# Patient Record
Sex: Female | Born: 1970 | Race: Black or African American | Hispanic: No | Marital: Single | Smoking: Never smoker
Health system: Southern US, Community
[De-identification: ages and names within clinical notes are randomized; demographics above are authoritative.]

## PROBLEM LIST (undated history)

## (undated) ENCOUNTER — Ambulatory Visit: Admission: EM | Payer: BC Managed Care – PPO | Source: Home / Self Care

## (undated) DIAGNOSIS — G629 Polyneuropathy, unspecified: Secondary | ICD-10-CM

## (undated) DIAGNOSIS — I10 Essential (primary) hypertension: Secondary | ICD-10-CM

## (undated) HISTORY — PX: OTHER SURGICAL HISTORY: SHX169

## (undated) HISTORY — PX: ABDOMINAL HYSTERECTOMY: SHX81

## (undated) HISTORY — DX: Essential (primary) hypertension: I10

---

## 2012-12-25 ENCOUNTER — Ambulatory Visit: Payer: Self-pay | Admitting: Family Medicine

## 2012-12-25 LAB — URIC ACID: Uric Acid: 4.8 mg/dL (ref 2.6–6.0)

## 2013-10-25 ENCOUNTER — Ambulatory Visit: Payer: Self-pay

## 2013-12-04 DIAGNOSIS — G5690 Unspecified mononeuropathy of unspecified upper limb: Secondary | ICD-10-CM | POA: Insufficient documentation

## 2013-12-04 DIAGNOSIS — I10 Essential (primary) hypertension: Secondary | ICD-10-CM | POA: Insufficient documentation

## 2013-12-04 DIAGNOSIS — G5790 Unspecified mononeuropathy of unspecified lower limb: Secondary | ICD-10-CM | POA: Insufficient documentation

## 2014-05-17 IMAGING — CR DG KNEE COMPLETE 4+V*R*
1 series · 4 of 4 positions shown · non-contrast
Comparison: none

REASON FOR EXAM: R knee pain and swelling for a week
COMMENTS:

PROCEDURE:     MDR - MDR KNEE RT COMPLETE W/OBLIQUES  - December 25, 2012  [DATE]
RESULT:     Comparison:  None

[Series 1: ap · 0.17mm/px · 4 of 4 slices shown]
[im 1/4]
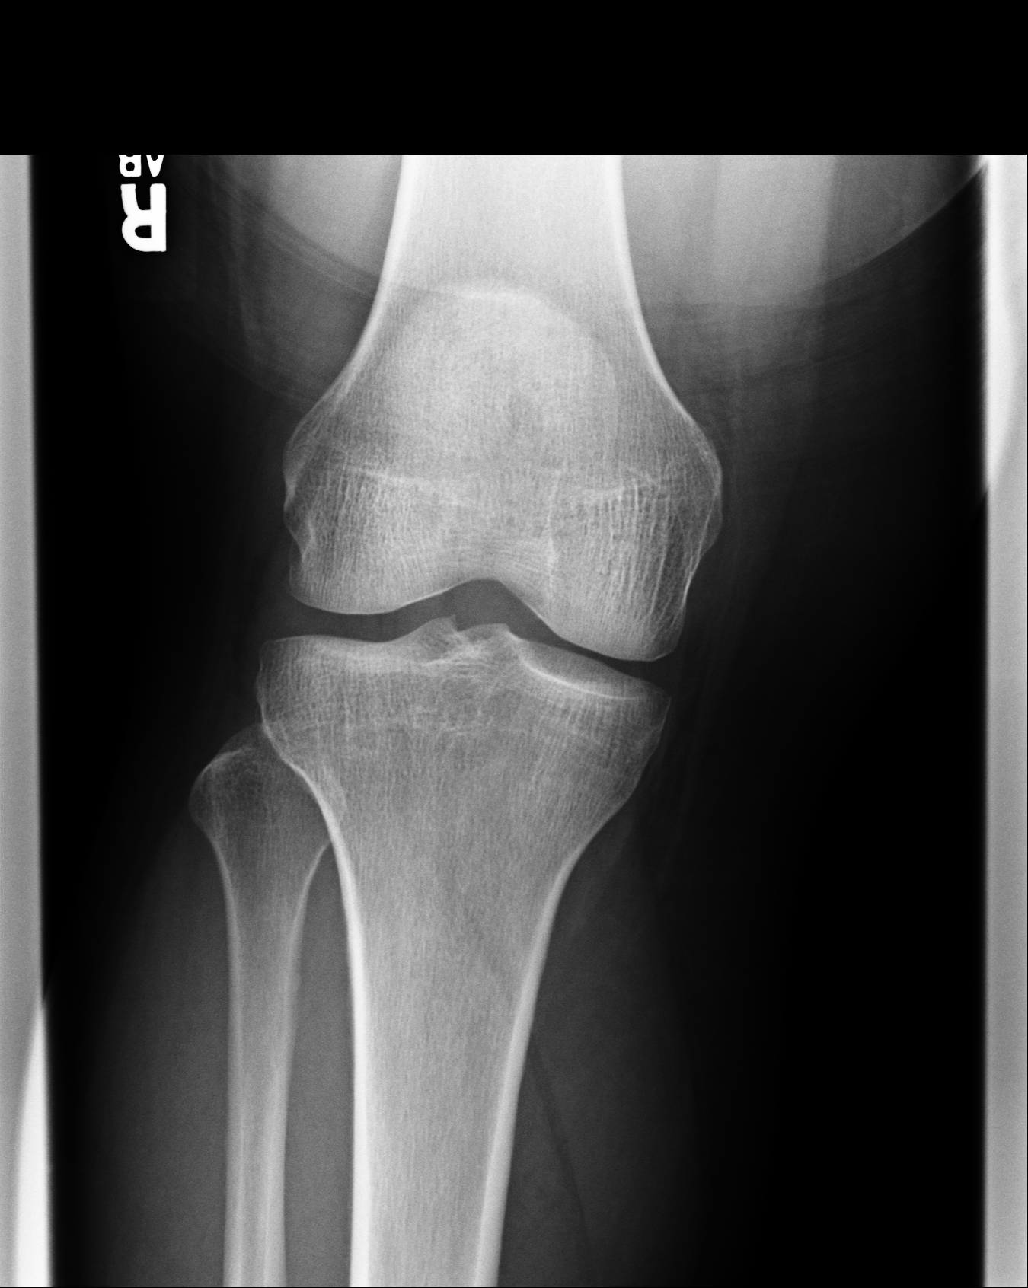
[im 2/4]
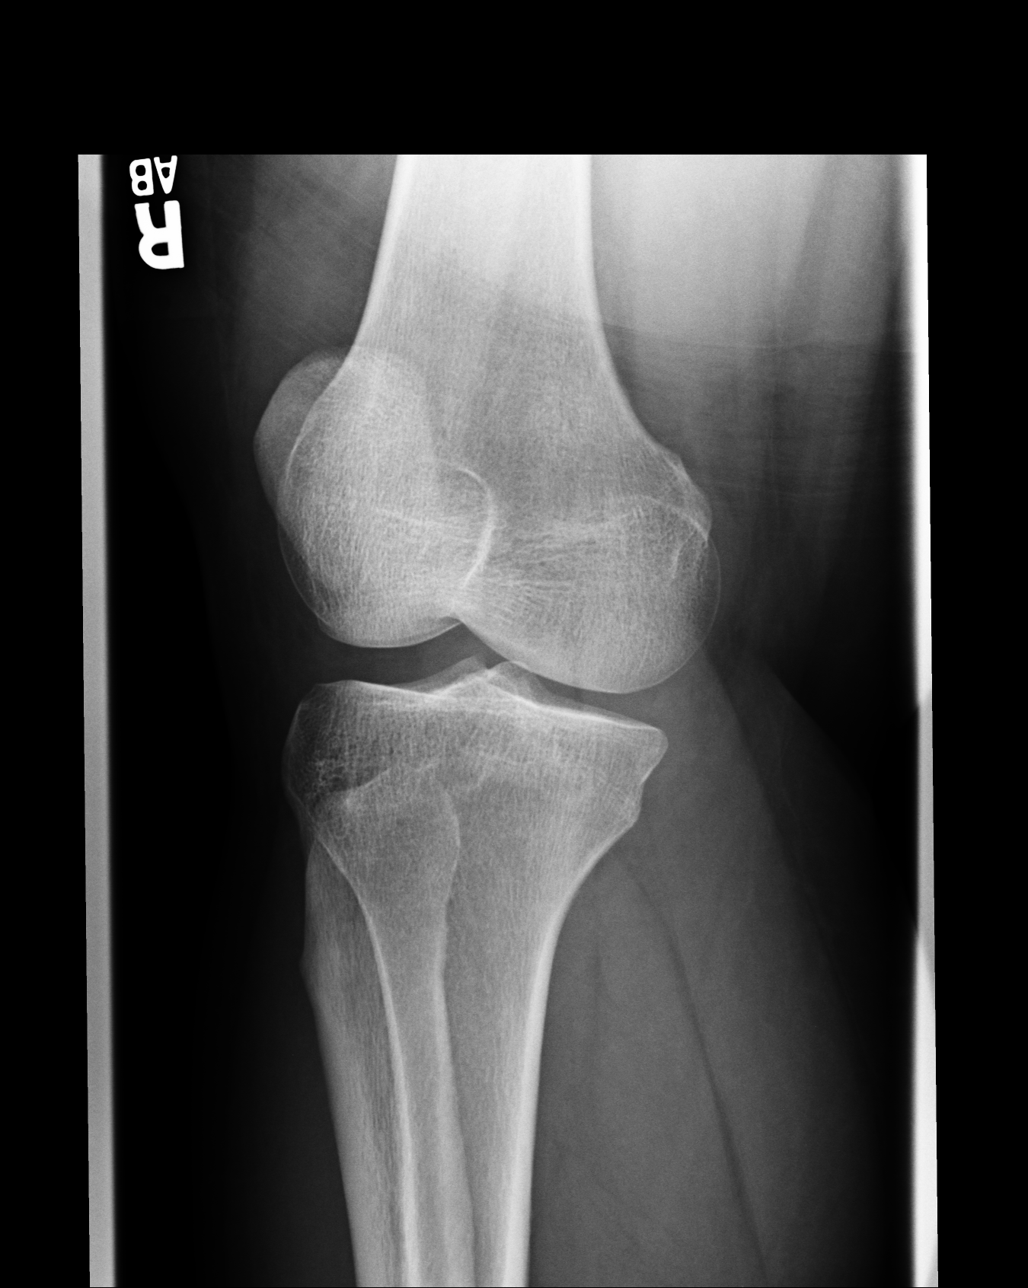
[im 3/4]
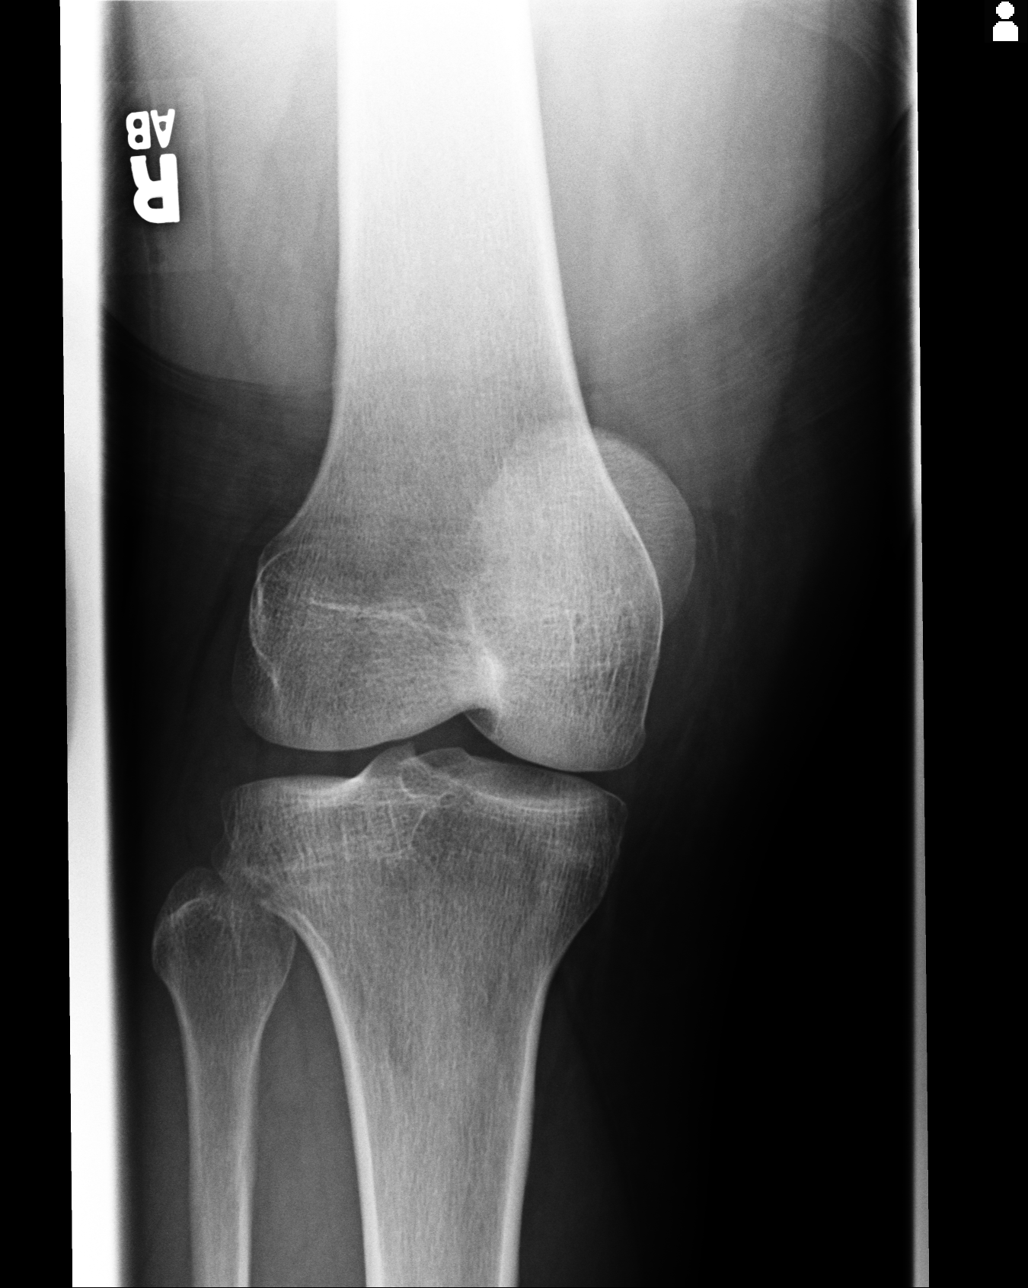
[im 4/4]
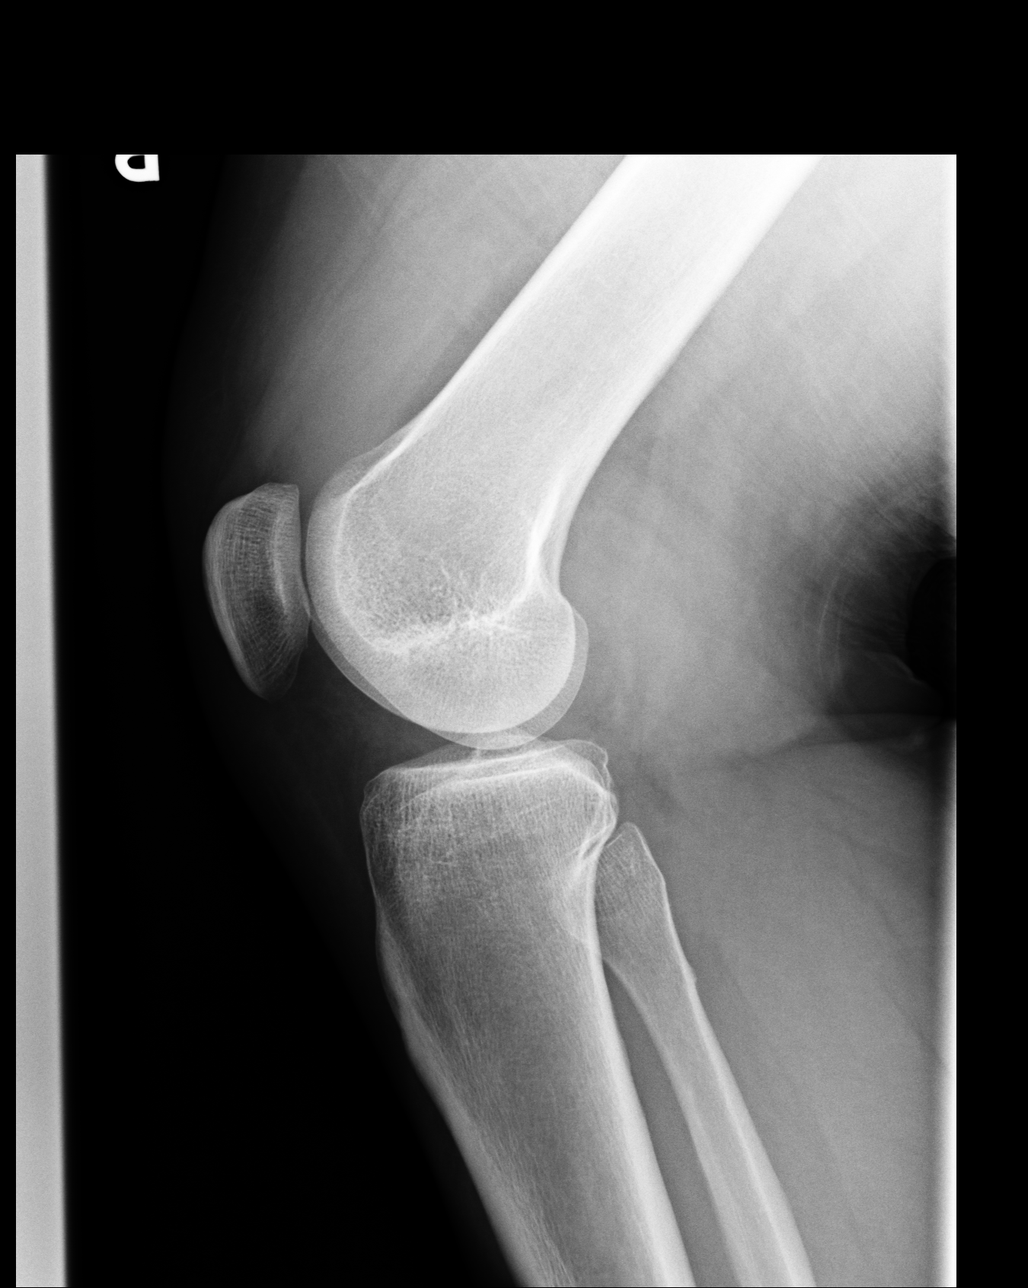

[4 of 4 positions shown; findings below may reference images not displayed]

FINDINGS: 4 views of the right knee demonstrates no acute fracture or dislocation.
There is a small joint effusion.
IMPRESSION: No acute osseous injury of the right knee.

[REDACTED]

## 2014-05-27 ENCOUNTER — Ambulatory Visit: Payer: Self-pay

## 2014-11-24 ENCOUNTER — Ambulatory Visit: Payer: Self-pay

## 2015-03-30 ENCOUNTER — Ambulatory Visit (INDEPENDENT_AMBULATORY_CARE_PROVIDER_SITE_OTHER): Payer: BLUE CROSS/BLUE SHIELD | Admitting: Podiatry

## 2015-03-30 ENCOUNTER — Ambulatory Visit (INDEPENDENT_AMBULATORY_CARE_PROVIDER_SITE_OTHER): Payer: BLUE CROSS/BLUE SHIELD

## 2015-03-30 ENCOUNTER — Encounter: Payer: Self-pay | Admitting: Podiatry

## 2015-03-30 VITALS — BP 118/86 | HR 88 | Resp 16 | Ht 64.0 in | Wt 257.0 lb

## 2015-03-30 DIAGNOSIS — M722 Plantar fascial fibromatosis: Secondary | ICD-10-CM | POA: Diagnosis not present

## 2015-03-30 DIAGNOSIS — G629 Polyneuropathy, unspecified: Secondary | ICD-10-CM

## 2015-03-30 MED ORDER — MELOXICAM 7.5 MG PO TABS: 7.5000 mg | ORAL_TABLET | Freq: Every day | ORAL | Status: DC

## 2015-03-30 MED ORDER — TRIAMCINOLONE ACETONIDE 10 MG/ML IJ SUSP
10.0000 mg | Freq: Once | INTRAMUSCULAR | Status: AC
  Administered 2015-03-30: 10 mg

## 2015-03-30 NOTE — Patient Instructions (Signed)
Plantar Fasciitis (Heel Spur Syndrome) with Rehab The plantar fascia is a fibrous, ligament-like, soft-tissue structure that spans the bottom of the foot. Plantar fasciitis is a condition that causes pain in the foot due to inflammation of the tissue. SYMPTOMS   Pain and tenderness on the underneath side of the foot.  Pain that worsens with standing or walking. CAUSES  Plantar fasciitis is caused by irritation and injury to the plantar fascia on the underneath side of the foot. Common mechanisms of injury include:  Direct trauma to bottom of the foot.  Damage to a small nerve that runs under the foot where the main fascia attaches to the heel bone.  Stress placed on the plantar fascia due to bone spurs. RISK INCREASES WITH:   Activities that place stress on the plantar fascia (running, jumping, pivoting, or cutting).  Poor strength and flexibility.  Improperly fitted shoes.  Tight calf muscles.  Flat feet.  Failure to warm-up properly before activity.  Obesity. PREVENTION  Warm up and stretch properly before activity.  Allow for adequate recovery between workouts.  Maintain physical fitness:  Strength, flexibility, and endurance.  Cardiovascular fitness.  Maintain a health body weight.  Avoid stress on the plantar fascia.  Wear properly fitted shoes, including arch supports for individuals who have flat feet. PROGNOSIS  If treated properly, then the symptoms of plantar fasciitis usually resolve without surgery. However, occasionally surgery is necessary. RELATED COMPLICATIONS   Recurrent symptoms that may result in a chronic condition.  Problems of the lower back that are caused by compensating for the injury, such as limping.  Pain or weakness of the foot during push-off following surgery.  Chronic inflammation, scarring, and partial or complete fascia tear, occurring more often from repeated injections. TREATMENT  Treatment initially involves the use of  ice and medication to help reduce pain and inflammation. The use of strengthening and stretching exercises may help reduce pain with activity, especially stretches of the Achilles tendon. These exercises may be performed at home or with a therapist. Your caregiver may recommend that you use heel cups of arch supports to help reduce stress on the plantar fascia. Occasionally, corticosteroid injections are given to reduce inflammation. If symptoms persist for greater than 6 months despite non-surgical (conservative), then surgery may be recommended.  MEDICATION   If pain medication is necessary, then nonsteroidal anti-inflammatory medications, such as aspirin and ibuprofen, or other minor pain relievers, such as acetaminophen, are often recommended.  Do not take pain medication within 7 days before surgery.  Prescription pain relievers may be given if deemed necessary by your caregiver. Use only as directed and only as much as you need.  Corticosteroid injections may be given by your caregiver. These injections should be reserved for the most serious cases, because they may only be given a certain number of times. HEAT AND COLD  Cold treatment (icing) relieves pain and reduces inflammation. Cold treatment should be applied for 10 to 15 minutes every 2 to 3 hours for inflammation and pain and immediately after any activity that aggravates your symptoms. Use ice packs or massage the area with a piece of ice (ice massage).  Heat treatment may be used prior to performing the stretching and strengthening activities prescribed by your caregiver, physical therapist, or athletic trainer. Use a heat pack or soak the injury in warm water. SEEK IMMEDIATE MEDICAL CARE IF:  Treatment seems to offer no benefit, or the condition worsens.  Any medications produce adverse side effects. EXERCISES RANGE   OF MOTION (ROM) AND STRETCHING EXERCISES - Plantar Fasciitis (Heel Spur Syndrome) These exercises may help you  when beginning to rehabilitate your injury. Your symptoms may resolve with or without further involvement from your physician, physical therapist or athletic trainer. While completing these exercises, remember:   Restoring tissue flexibility helps normal motion to return to the joints. This allows healthier, less painful movement and activity.  An effective stretch should be held for at least 30 seconds.  A stretch should never be painful. You should only feel a gentle lengthening or release in the stretched tissue. RANGE OF MOTION - Toe Extension, Flexion  Sit with your right / left leg crossed over your opposite knee.  Grasp your toes and gently pull them back toward the top of your foot. You should feel a stretch on the bottom of your toes and/or foot.  Hold this stretch for __________ seconds.  Now, gently pull your toes toward the bottom of your foot. You should feel a stretch on the top of your toes and or foot.  Hold this stretch for __________ seconds. Repeat __________ times. Complete this stretch __________ times per day.  RANGE OF MOTION - Ankle Dorsiflexion, Active Assisted  Remove shoes and sit on a chair that is preferably not on a carpeted surface.  Place right / left foot under knee. Extend your opposite leg for support.  Keeping your heel down, slide your right / left foot back toward the chair until you feel a stretch at your ankle or calf. If you do not feel a stretch, slide your bottom forward to the edge of the chair, while still keeping your heel down.  Hold this stretch for __________ seconds. Repeat __________ times. Complete this stretch __________ times per day.  STRETCH - Gastroc, Standing  Place hands on wall.  Extend right / left leg, keeping the front knee somewhat bent.  Slightly point your toes inward on your back foot.  Keeping your right / left heel on the floor and your knee straight, shift your weight toward the wall, not allowing your back to  arch.  You should feel a gentle stretch in the right / left calf. Hold this position for __________ seconds. Repeat __________ times. Complete this stretch __________ times per day. STRETCH - Soleus, Standing  Place hands on wall.  Extend right / left leg, keeping the other knee somewhat bent.  Slightly point your toes inward on your back foot.  Keep your right / left heel on the floor, bend your back knee, and slightly shift your weight over the back leg so that you feel a gentle stretch deep in your back calf.  Hold this position for __________ seconds. Repeat __________ times. Complete this stretch __________ times per day. STRETCH - Gastrocsoleus, Standing  Note: This exercise can place a lot of stress on your foot and ankle. Please complete this exercise only if specifically instructed by your caregiver.   Place the ball of your right / left foot on a step, keeping your other foot firmly on the same step.  Hold on to the wall or a rail for balance.  Slowly lift your other foot, allowing your body weight to press your heel down over the edge of the step.  You should feel a stretch in your right / left calf.  Hold this position for __________ seconds.  Repeat this exercise with a slight bend in your right / left knee. Repeat __________ times. Complete this stretch __________ times per day.    STRENGTHENING EXERCISES - Plantar Fasciitis (Heel Spur Syndrome)  These exercises may help you when beginning to rehabilitate your injury. They may resolve your symptoms with or without further involvement from your physician, physical therapist or athletic trainer. While completing these exercises, remember:   Muscles can gain both the endurance and the strength needed for everyday activities through controlled exercises.  Complete these exercises as instructed by your physician, physical therapist or athletic trainer. Progress the resistance and repetitions only as guided. STRENGTH -  Towel Curls  Sit in a chair positioned on a non-carpeted surface.  Place your foot on a towel, keeping your heel on the floor.  Pull the towel toward your heel by only curling your toes. Keep your heel on the floor.  If instructed by your physician, physical therapist or athletic trainer, add ____________________ at the end of the towel. Repeat __________ times. Complete this exercise __________ times per day. STRENGTH - Ankle Inversion  Secure one end of a rubber exercise band/tubing to a fixed object (table, pole). Loop the other end around your foot just before your toes.  Place your fists between your knees. This will focus your strengthening at your ankle.  Slowly, pull your big toe up and in, making sure the band/tubing is positioned to resist the entire motion.  Hold this position for __________ seconds.  Have your muscles resist the band/tubing as it slowly pulls your foot back to the starting position. Repeat __________ times. Complete this exercises __________ times per day.  Document Released: 10/23/2005 Document Revised: 01/15/2012 Document Reviewed: 02/04/2009 [employer] Patient Information 2015 [employer]. This information is not intended to replace advice given to you by your health care provider. Make sure you discuss any questions you have with your health care provider.  

## 2015-03-31 ENCOUNTER — Encounter: Payer: Self-pay | Admitting: Podiatry

## 2015-04-04 DIAGNOSIS — M722 Plantar fascial fibromatosis: Secondary | ICD-10-CM | POA: Insufficient documentation
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
## 2015-04-20 ENCOUNTER — Ambulatory Visit (INDEPENDENT_AMBULATORY_CARE_PROVIDER_SITE_OTHER): Payer: BLUE CROSS/BLUE SHIELD

## 2015-04-20 ENCOUNTER — Ambulatory Visit (INDEPENDENT_AMBULATORY_CARE_PROVIDER_SITE_OTHER): Payer: BLUE CROSS/BLUE SHIELD | Admitting: Podiatry

## 2015-04-20 ENCOUNTER — Encounter: Payer: Self-pay | Admitting: Podiatry

## 2015-04-20 DIAGNOSIS — M722 Plantar fascial fibromatosis: Secondary | ICD-10-CM

## 2015-04-20 MED ORDER — TRIAMCINOLONE ACETONIDE 10 MG/ML IJ SUSP
10.0000 mg | Freq: Once | INTRAMUSCULAR | Status: AC
  Administered 2015-04-20: 10 mg
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
## 2015-04-26 ENCOUNTER — Other Ambulatory Visit: Payer: Self-pay | Admitting: Podiatry

## 2015-05-18 ENCOUNTER — Encounter: Payer: Self-pay | Admitting: Podiatry

## 2015-05-18 ENCOUNTER — Ambulatory Visit (INDEPENDENT_AMBULATORY_CARE_PROVIDER_SITE_OTHER): Payer: BLUE CROSS/BLUE SHIELD | Admitting: Podiatry

## 2015-05-18 VITALS — BP 148/104 | HR 85 | Resp 18

## 2015-05-18 DIAGNOSIS — M722 Plantar fascial fibromatosis: Secondary | ICD-10-CM

## 2015-05-18 DIAGNOSIS — M629 Disorder of muscle, unspecified: Secondary | ICD-10-CM

## 2015-05-18 MED ORDER — TRIAMCINOLONE ACETONIDE 10 MG/ML IJ SUSP
10.0000 mg | Freq: Once | INTRAMUSCULAR | Status: AC
  Administered 2015-05-18: 10 mg
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
## 2015-06-04 ENCOUNTER — Telehealth: Payer: Self-pay | Admitting: *Deleted

## 2015-06-04 NOTE — Telephone Encounter (Signed)
-----   Message from Sinda Du sent at 06/04/2015 12:08 PM EDT ----- Regarding: MRI Amy from [facility] called to inform us that they have been calling Patient Heidi Kline to schedule her MRI but no answer.

## 2015-10-13 DIAGNOSIS — M722 Plantar fascial fibromatosis: Secondary | ICD-10-CM

## 2015-12-30 ENCOUNTER — Ambulatory Visit
Admission: EM | Admit: 2015-12-30 | Discharge: 2015-12-30 | Disposition: A | Discharge status: Home / Self Care | Payer: Medicaid Other | Attending: Internal Medicine | Admitting: Internal Medicine

## 2015-12-30 DIAGNOSIS — I10 Essential (primary) hypertension: Secondary | ICD-10-CM | POA: Insufficient documentation

## 2015-12-30 DIAGNOSIS — R05 Cough: Secondary | ICD-10-CM | POA: Insufficient documentation

## 2015-12-30 DIAGNOSIS — R51 Headache: Secondary | ICD-10-CM | POA: Diagnosis present

## 2015-12-30 DIAGNOSIS — J069 Acute upper respiratory infection, unspecified: Secondary | ICD-10-CM | POA: Diagnosis not present

## 2015-12-30 DIAGNOSIS — B9789 Other viral agents as the cause of diseases classified elsewhere: Secondary | ICD-10-CM

## 2015-12-30 LAB — RAPID INFLUENZA A&B ANTIGENS
Influenza A (ARMC): NOT DETECTED
Influenza B (ARMC): NOT DETECTED

## 2015-12-30 MED ORDER — HYDROCOD POLST-CPM POLST ER 10-8 MG/5ML PO SUER: 5.0000 mL | Freq: Two times a day (BID) | ORAL | Status: DC | PRN

## 2015-12-30 MED ORDER — FLUTICASONE PROPIONATE 50 MCG/ACT NA SUSP: 2.0000 | Freq: Every day | NASAL | Status: DC

## 2015-12-30 NOTE — ED Notes (Signed)
Started 2 days ago with sinus congestion and pressure. + cough, no fever and bilateral ear pain

## 2015-12-30 NOTE — Discharge Instructions (Signed)
Upper Respiratory Infection, Adult °Most upper respiratory infections (URIs) are a viral infection of the air passages leading to the lungs. A URI affects the nose, throat, and upper air passages. The most common type of URI is nasopharyngitis and is typically referred to as "the common cold." °URIs run their course and usually go away on their own. Most of the time, a URI does not require medical attention, but sometimes a bacterial infection in the upper airways can follow a viral infection. This is called a secondary infection. Sinus and middle ear infections are common types of secondary upper respiratory infections. °Bacterial pneumonia can also complicate a URI. A URI can worsen asthma and chronic obstructive pulmonary disease (COPD). Sometimes, these complications can require emergency medical care and may be life threatening.  °CAUSES °Almost all URIs are caused by viruses. A virus is a type of germ and can spread from one person to another.  °RISKS FACTORS °You may be at risk for a URI if:  °· You smoke.   °· You have chronic heart or lung disease. °· You have a weakened defense (immune) system.   °· You are very young or very old.   °· You have nasal allergies or asthma. °· You work in crowded or poorly ventilated areas. °· You work in health care facilities or schools. °SIGNS AND SYMPTOMS  °Symptoms typically develop 2-3 days after you come in contact with a cold virus. Most viral URIs last 7-10 days. However, viral URIs from the influenza virus (flu virus) can last 14-18 days and are typically more severe. Symptoms may include:  °· Runny or stuffy (congested) nose.   °· Sneezing.   °· Cough.   °· Sore throat.   °· Headache.   °· Fatigue.   °· Fever.   °· Loss of appetite.   °· Pain in your forehead, behind your eyes, and over your cheekbones (sinus pain). °· Muscle aches.   °DIAGNOSIS  °Your health care provider may diagnose a URI by: °· Physical exam. °· Tests to check that your symptoms are not due to  another condition such as: °· Strep throat. °· Sinusitis. °· Pneumonia. °· Asthma. °TREATMENT  °A URI goes away on its own with time. It cannot be cured with medicines, but medicines may be prescribed or recommended to relieve symptoms. Medicines may help: °· Reduce your fever. °· Reduce your cough. °· Relieve nasal congestion. °HOME CARE INSTRUCTIONS  °· Take medicines only as directed by your health care provider.   °· Gargle warm saltwater or take cough drops to comfort your throat as directed by your health care provider. °· Use a warm mist humidifier or inhale steam from a shower to increase air moisture. This may make it easier to breathe. °· Drink enough fluid to keep your urine clear or pale yellow.   °· Eat soups and other clear broths and maintain good nutrition.   °· Rest as needed.   °· Return to work when your temperature has returned to normal or as your health care provider advises. You may need to stay home longer to avoid infecting others. You can also use a face mask and careful hand washing to prevent spread of the virus. °· Increase the usage of your inhaler if you have asthma.   °· Do not use any tobacco products, including cigarettes, chewing tobacco, or electronic cigarettes. If you need help quitting, ask your health care provider. °PREVENTION  °The best way to protect yourself from getting a cold is to practice good hygiene.  °· Avoid oral or hand contact with people with cold   symptoms.   °· Wash your hands often if contact occurs.   °There is no clear evidence that vitamin C, vitamin E, echinacea, or exercise reduces the chance of developing a cold. However, it is always recommended to get plenty of rest, exercise, and practice good nutrition.  °SEEK MEDICAL CARE IF:  °· You are getting worse rather than better.   °· Your symptoms are not controlled by medicine.   °· You have chills. °· You have worsening shortness of breath. °· You have brown or red mucus. °· You have yellow or brown nasal  discharge. °· You have pain in your face, especially when you bend forward. °· You have a fever. °· You have swollen neck glands. °· You have pain while swallowing. °· You have white areas in the back of your throat. °SEEK IMMEDIATE MEDICAL CARE IF:  °· You have severe or persistent: °¨ Headache. °¨ Ear pain. °¨ Sinus pain. °¨ Chest pain. °· You have chronic lung disease and any of the following: °¨ Wheezing. °¨ Prolonged cough. °¨ Coughing up blood. °¨ A change in your usual mucus. °· You have a stiff neck. °· You have changes in your: °¨ Vision. °¨ Hearing. °¨ Thinking. °¨ Mood. °MAKE SURE YOU:  °· Understand these instructions. °· Will watch your condition. °· Will get help right away if you are not doing well or get worse. °  °This information is not intended to replace advice given to you by your health care provider. Make sure you discuss any questions you have with your health care provider. °  °Document Released: 04/18/2001 Document Revised: 03/09/2015 Document Reviewed: 01/28/2014 °Elsevier Interactive Patient Education ©2016 [employer] ° °Cough, Adult °A cough helps to clear your throat and lungs. A cough may last only 2-3 weeks (acute), or it may last longer than 8 weeks (chronic). Many different things can cause a cough. A cough may be a sign of an illness or another medical condition. °HOME CARE °· Pay attention to any changes in your cough. °· Take medicines only as told by your doctor. °¨ If you were prescribed an antibiotic medicine, take it as told by your doctor. Do not stop taking it even if you start to feel better. °¨ Talk with your doctor before you try using a cough medicine. °· Drink enough fluid to keep your pee (urine) clear or pale yellow. °· If the air is dry, use a cold steam vaporizer or humidifier in your home. °· Stay away from things that make you cough at work or at home. °· If your cough is worse at night, try using extra pillows to raise your head up higher while you  sleep. °· Do not smoke, and try not to be around smoke. If you need help quitting, ask your doctor. °· Do not have caffeine. °· Do not drink alcohol. °· Rest as needed. °GET HELP IF: °· You have new problems (symptoms). °· You cough up yellow fluid (pus). °· Your cough does not get better after 2-3 weeks, or your cough gets worse. °· Medicine does not help your cough and you are not sleeping well. °· You have pain that gets worse or pain that is not helped with medicine. °· You have a fever. °· You are losing weight and you do not know why. °· You have night sweats. °GET HELP RIGHT AWAY IF: °· You cough up blood. °· You have trouble breathing. °· Your heartbeat is very fast. °  °This information is not intended to replace   advice given to you by your health care provider. Make sure you discuss any questions you have with your health care provider. °  °Document Released: 07/06/2011 Document Revised: 07/14/2015 Document Reviewed: 12/30/2014 °Elsevier Interactive Patient Education ©2016 [employer]. ° °

## 2015-12-30 NOTE — ED Provider Notes (Signed)
CSN: [id number]     Arrival date & time 12/30/15  1411 History   First MD Initiated Contact with Patient 12/30/15 1553     Chief Complaint  Patient presents with  . Facial Pain   (Consider location/radiation/quality/duration/timing/severity/associated sxs/prior Treatment) HPI Comments: This is a 45 year old African American female presents to urgent care complaining of facial pain. The patient points to her ears and jaw as a source of the pain. The pain began around 2 days ago after the patient began experiencing cough and headache. She has not had any fevers but does admit to body aches. Patient also complains of sore throat and decreased appetite. 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  The history is provided by the patient.    Past Medical History  Diagnosis Date  . Hypertension    Past Surgical History  Procedure Laterality Date  . Skin grafts    . Abdominal hysterectomy     Family History  Problem Relation Age of Onset  . Diabetes Mother   . Cancer Mother   . Diabetes Father   . Cancer Father    Social History  Substance Use Topics  . Smoking status: Never Smoker   . Smokeless tobacco: None  . Alcohol Use: No   OB History    No data available     Review of Systems  Constitutional: Positive for appetite change and fatigue. Negative for fever and diaphoresis.  HENT: Negative for facial swelling.   Respiratory: Positive for cough. Negative for shortness of breath and wheezing.   Cardiovascular: Negative for chest pain.  Genitourinary: Negative for dysuria, urgency and frequency.  Neurological: Positive for headaches.  Hematological: Positive for adenopathy.    Allergies  Percocet  Home Medications   Prior to Admission medications   Medication Sig Start Date End Date Taking? Authorizing Provider  carbamazepine (TEGRETOL) 200 MG tablet  02/16/15  Yes Historical Provider, MD  gabapentin (NEURONTIN) 300 MG capsule  Take 300 mg by mouth 3 (three) times daily.   Yes Historical Provider, MD  lisinopril (PRINIVIL,ZESTRIL) 10 MG tablet Take 10 mg by mouth daily.   Yes Historical Provider, MD  meloxicam (MOBIC) 7.5 MG tablet TAKE 1 TABLET (7.5 MG TOTAL) BY MOUTH DAILY. 04/26/15  Yes Vivi Barrack, DPM  traZODone (DESYREL) 50 MG tablet  03/11/15  Yes Historical Provider, MD  chlorpheniramine-HYDROcodone (TUSSIONEX PENNKINETIC ER) 10-8 MG/5ML SUER Take 5 mLs by mouth every 12 (twelve) hours as needed for cough. 12/30/15   Arnaldo Natal, MD   Meds Ordered and Administered this Visit  Medications - No data to display  BP 146/98 mmHg  Pulse 85  Temp(Src) 98.3 F (36.8 C) (Oral)  Resp 16  Ht  (1.626 m)  Wt 243 lb (110.224 kg)  BMI 41.69 kg/m2  SpO2 100% No data found.   Physical Exam  Constitutional: She is oriented to person, place, and time. She appears well-developed and well-nourished. No distress.  HENT:  Head: Normocephalic and atraumatic.  Right Ear: A middle ear effusion is present.  Left Ear: A middle ear effusion is present.  Mouth/Throat: Oropharynx is clear and moist.  Petechiae present posterior oropharynx  Eyes: Conjunctivae and EOM are normal. Pupils are equal, round, and reactive to light. No scleral icterus.  Neck: Normal range of motion. Neck supple. No JVD present. No tracheal deviation present. No thyromegaly present.  Cardiovascular: Normal rate, regular rhythm and normal heart sounds.  Exam reveals no gallop and no friction rub.   No murmur heard. Pulmonary/Chest: Effort normal and breath sounds normal.  Abdominal: Soft. Bowel sounds are normal. She exhibits no distension. There is no tenderness.  Musculoskeletal: Normal range of motion. She exhibits no edema.  Lymphadenopathy:    She has cervical adenopathy.       Right cervical: Superficial cervical adenopathy present.  Neurological: She is alert and oriented to person, place, and time. No cranial nerve deficit.   Skin: Skin is warm and dry.  Psychiatric: She has a normal mood and affect. Her behavior is normal. Judgment and thought content normal.  Nursing note and vitals reviewed.   ED Course  Procedures (including critical care time)  Labs Review Labs Reviewed  RAPID INFLUENZA A&B ANTIGENS [facility] ONLY)    Imaging Review No results found.   Visual Acuity Review  Right Eye Distance:   Left Eye Distance:   Bilateral Distance:    Right Eye Near:   Left Eye Near:    Bilateral Near:         MDM   1. Viral URI with cough    Suspicion is low for the flu. The patient may have a flu-like virus. No indication for Tamiflu at this time. Return to clinic for inability to maintain oral intake for 36-48 hours or for worsening symptoms including fever, nausea, vomiting or diarrhea.    Arnaldo Natal, MD 12/30/15 [phone]

## 2016-01-13 ENCOUNTER — Other Ambulatory Visit: Payer: Self-pay | Admitting: Family Medicine

## 2016-01-13 ENCOUNTER — Ambulatory Visit
Admission: RE | Admit: 2016-01-13 | Discharge: 2016-01-13 | Disposition: A | Discharge status: Home / Self Care | Payer: Self-pay | Source: Ambulatory Visit | Attending: Family Medicine | Admitting: Family Medicine

## 2016-01-13 DIAGNOSIS — R7611 Nonspecific reaction to tuberculin skin test without active tuberculosis: Secondary | ICD-10-CM | POA: Insufficient documentation

## 2016-04-06 ENCOUNTER — Ambulatory Visit
Admission: EM | Admit: 2016-04-06 | Discharge: 2016-04-06 | Disposition: A | Discharge status: Home / Self Care | Payer: Medicaid Other | Attending: Family Medicine | Admitting: Family Medicine

## 2016-04-06 ENCOUNTER — Encounter: Payer: Self-pay | Admitting: Emergency Medicine

## 2016-04-06 DIAGNOSIS — R300 Dysuria: Secondary | ICD-10-CM | POA: Diagnosis present

## 2016-04-06 DIAGNOSIS — N390 Urinary tract infection, site not specified: Secondary | ICD-10-CM | POA: Diagnosis not present

## 2016-04-06 DIAGNOSIS — I10 Essential (primary) hypertension: Secondary | ICD-10-CM | POA: Insufficient documentation

## 2016-04-06 LAB — URINALYSIS COMPLETE WITH MICROSCOPIC (ARMC ONLY)
Bilirubin Urine: NEGATIVE
GLUCOSE, UA: NEGATIVE mg/dL
Hgb urine dipstick: NEGATIVE
KETONES UR: NEGATIVE mg/dL
Leukocytes, UA: NEGATIVE
Nitrite: NEGATIVE
PROTEIN: NEGATIVE mg/dL
RBC / HPF: NONE SEEN RBC/hpf (ref 0–5)
Specific Gravity, Urine: 1.03 (ref 1.005–1.030)
pH: 5.5 (ref 5.0–8.0)

## 2016-04-06 MED ORDER — PHENAZOPYRIDINE HCL 200 MG PO TABS: 200.0000 mg | ORAL_TABLET | Freq: Three times a day (TID) | ORAL | Status: DC | PRN

## 2016-04-06 MED ORDER — NITROFURANTOIN MONOHYD MACRO 100 MG PO CAPS: 100.0000 mg | ORAL_CAPSULE | Freq: Two times a day (BID) | ORAL | Status: DC

## 2016-04-06 MED ORDER — FLUCONAZOLE 150 MG PO TABS: 150.0000 mg | ORAL_TABLET | Freq: Once | ORAL | Status: DC

## 2016-04-06 NOTE — Discharge Instructions (Signed)
Dysuria °Dysuria is pain or discomfort while urinating. The pain or discomfort may be felt in the tube that carries urine out of the bladder (urethra) or in the surrounding tissue of the genitals. The pain may also be felt in the groin area, lower abdomen, and lower back. You may have to urinate frequently or have the sudden feeling that you have to urinate (urgency). Dysuria can affect both men and women, but is more common in women. °Dysuria can be caused by many different things, including: °· Urinary tract infection in women. °· Infection of the kidney or bladder. °· Kidney stones or bladder stones. °· Certain sexually transmitted infections (STIs), such as chlamydia. °· Dehydration. °· Inflammation of the vagina. °· Use of certain medicines. °· Use of certain soaps or scented products that cause irritation. °HOME CARE INSTRUCTIONS °Watch your dysuria for any changes. The following actions may help to reduce any discomfort you are feeling: °· Drink enough fluid to keep your urine clear or pale yellow. °· Empty your bladder often. Avoid holding urine for long periods of time. °· After a bowel movement or urination, women should cleanse from front to back, using each tissue only once. °· Empty your bladder after sexual intercourse. °· Take medicines only as directed by your health care provider. °· If you were prescribed an antibiotic medicine, finish it all even if you start to feel better. °· Avoid caffeine, tea, and alcohol. They can irritate the bladder and make dysuria worse. In men, alcohol may irritate the prostate. °· Keep all follow-up visits as directed by your health care provider. This is important. °· If you had any tests done to find the cause of dysuria, it is your responsibility to obtain your test results. Ask the lab or department performing the test when and how you will get your results. Talk with your health care provider if you have any questions about your results. °SEEK MEDICAL CARE  IF: °· You develop pain in your back or sides. °· You have a fever. °· You have nausea or vomiting. °· You have blood in your urine. °· You are not urinating as often as you usually do. °SEEK IMMEDIATE MEDICAL CARE IF: °· You pain is severe and not relieved with medicines. °· You are unable to hold down any fluids. °· You or someone else notices a change in your mental function. °· You have a rapid heartbeat at rest. °· You have shaking or chills. °· You feel extremely weak. °  °This information is not intended to replace advice given to you by your health care provider. Make sure you discuss any questions you have with your health care provider. °  °Document Released: 07/21/2004 Document Revised: 11/13/2014 Document Reviewed: 06/18/2014 °Elsevier Interactive Patient Education ©2016 [employer] Inc. ° °Urinary Tract Infection °A urinary tract infection (UTI) can occur any place along the urinary tract. The tract includes the kidneys, ureters, bladder, and urethra. A type of germ called bacteria often causes a UTI. UTIs are often helped with antibiotic medicine.  °HOME CARE  °· If given, take antibiotics as told by your doctor. Finish them even if you start to feel better. °· Drink enough fluids to keep your pee (urine) clear or pale yellow. °· Avoid tea, drinks with caffeine, and bubbly (carbonated) drinks. °· Pee often. Avoid holding your pee in for a long time. °· Pee before and after having sex (intercourse). °· Wipe from front to back after you poop (bowel movement) if you are a   woman. Use each tissue only once. °GET HELP RIGHT AWAY IF:  °· You have back pain. °· You have lower belly (abdominal) pain. °· You have chills. °· You feel sick to your stomach (nauseous). °· You throw up (vomit). °· Your burning or discomfort with peeing does not go away. °· You have a fever. °· Your symptoms are not better in 3 days. °MAKE SURE YOU:  °· Understand these instructions. °· Will watch your condition. °· Will get help right  away if you are not doing well or get worse. °  °This information is not intended to replace advice given to you by your health care provider. Make sure you discuss any questions you have with your health care provider. °  °Document Released: 04/10/2008 Document Revised: 11/13/2014 Document Reviewed: 05/23/2012 °Elsevier Interactive Patient Education ©2016 Elsevier Inc. ° °

## 2016-04-06 NOTE — ED Provider Notes (Signed)
CSN: [id number]     Arrival date & time 04/06/16  1552 History   First MD Initiated Contact with Patient 04/06/16 1701    Nurses notes were reviewed. Chief Complaint  Patient presents with  . Dysuria   She is here because of dysuria. She states it started Friday she's been drinking less water eating cranberry fruit and drinking cranberry juice. She thought that she could handle it but is progressively gotten worse. She states that she's gets these UTIs. She's had a hysterectomy she's not had sexual relations in about 8 months she denies any discharge.  Past history she's had abdominal hysterectomy skin grafts and she has hypertension. She never smoked and her mother and father both had diabetes and cancer.   (Consider location/radiation/quality/duration/timing/severity/associated sxs/prior Treatment) Patient is a 45 y.o. female presenting with dysuria. The history is provided by the patient. No language interpreter was used.  Dysuria Pain quality:  Aching and burning Pain severity:  Moderate Duration:  7 days Timing:  Constant Progression:  Worsening Chronicity:  New Recent urinary tract infections: no   Relieved by:  Nothing Ineffective treatments:  Cranberry juice and phenazopyridine Urinary symptoms: no hematuria   Associated symptoms: no abdominal pain, no fever, no flank pain, no genital lesions and no vaginal discharge   Risk factors: hx of pyelonephritis and recurrent urinary tract infections     Past Medical History  Diagnosis Date  . Hypertension    Past Surgical History  Procedure Laterality Date  . Skin grafts    . Abdominal hysterectomy     Family History  Problem Relation Age of Onset  . Diabetes Mother   . Cancer Mother   . Diabetes Father   . Cancer Father    Social History  Substance Use Topics  . Smoking status: Never Smoker   . Smokeless tobacco: None  . Alcohol Use: No   OB History    No data available     Review of Systems  Constitutional:  Negative for fever.  Gastrointestinal: Negative for abdominal pain.  Genitourinary: Positive for dysuria. Negative for flank pain and vaginal discharge.  All other systems reviewed and are negative.   Allergies  Percocet  Home Medications   Prior to Admission medications   Medication Sig Start Date End Date Taking? Authorizing Provider  carbamazepine (TEGRETOL) 200 MG tablet  02/16/15   Historical Provider, MD  chlorpheniramine-HYDROcodone (TUSSIONEX PENNKINETIC ER) 10-8 MG/5ML SUER Take 5 mLs by mouth every 12 (twelve) hours as needed for cough. 12/30/15   Arnaldo Natal, MD  fluconazole (DIFLUCAN) 150 MG tablet Take 1 tablet (150 mg total) by mouth once. 04/06/16   Hassan Rowan, MD  fluticasone (FLONASE) 50 MCG/ACT nasal spray Place 2 sprays into both nostrils daily. 12/30/15   Arnaldo Natal, MD  gabapentin (NEURONTIN) 300 MG capsule Take 300 mg by mouth 3 (three) times daily.    Historical Provider, MD  lisinopril (PRINIVIL,ZESTRIL) 10 MG tablet Take 10 mg by mouth daily.    Historical Provider, MD  meloxicam (MOBIC) 7.5 MG tablet TAKE 1 TABLET (7.5 MG TOTAL) BY MOUTH DAILY. 04/26/15   Vivi Barrack, DPM  nitrofurantoin, macrocrystal-monohydrate, (MACROBID) 100 MG capsule Take 1 capsule (100 mg total) by mouth 2 (two) times daily. 04/06/16   Hassan Rowan, MD  phenazopyridine (PYRIDIUM) 200 MG tablet Take 1 tablet (200 mg total) by mouth 3 (three) times daily as needed for pain. 04/06/16   Hassan Rowan, MD  traZODone (DESYREL) 50 MG tablet  03/11/15   Historical Provider, MD   Meds Ordered and Administered this Visit  Medications - No data to display  BP 151/75 mmHg  Pulse 80  Temp(Src) 97.7 F (36.5 C) (Tympanic)  Resp 16  Ht 5\' 4"  (1.626 m)  Wt 243 lb (110.224 kg)  BMI 41.69 kg/m2  SpO2 100% No data found.   Physical Exam  Constitutional: She is oriented to person, place, and time. She appears well-developed and well-nourished.  HENT:  Head: Normocephalic and atraumatic.   Eyes: Pupils are equal, round, and reactive to light.  Neck: Neck supple.  Abdominal: Soft. She exhibits no distension and no mass. There is no hepatosplenomegaly. There is no tenderness. There is no CVA tenderness.  Musculoskeletal: Normal range of motion.  Neurological: She is alert and oriented to person, place, and time. No cranial nerve deficit.  Skin: Skin is warm and dry. No erythema.  Psychiatric: She has a normal mood and affect.  Vitals reviewed.   ED Course  Procedures (including critical care time)  Labs Review Labs Reviewed  URINALYSIS COMPLETEWITH MICROSCOPIC ([facility] ONLY) - Abnormal; Notable for the following:    APPearance HAZY (*)    Bacteria, UA FEW (*)    Squamous Epithelial / LPF 6-30 (*)    All other components within normal limits  URINE CULTURE    Imaging Review No results found.   Visual Acuity Review  Right Eye Distance:   Left Eye Distance:   Bilateral Distance:    Right Eye Near:   Left Eye Near:    Bilateral Near:          Results for orders placed or performed during the hospital encounter of 04/06/16  Urinalysis complete, with microscopic  Result Value Ref Range   Color, Urine YELLOW YELLOW   APPearance HAZY (A) CLEAR   Glucose, UA NEGATIVE NEGATIVE mg/dL   Bilirubin Urine NEGATIVE NEGATIVE   Ketones, ur NEGATIVE NEGATIVE mg/dL   Specific Gravity, Urine 1.030 1.005 - 1.030   Hgb urine dipstick NEGATIVE NEGATIVE   pH 5.5 5.0 - 8.0   Protein, ur NEGATIVE NEGATIVE mg/dL   Nitrite NEGATIVE NEGATIVE   Leukocytes, UA NEGATIVE NEGATIVE   RBC / HPF NONE SEEN 0 - 5 RBC/hpf   WBC, UA 0-5 0 - 5 WBC/hpf   Bacteria, UA FEW (A) NONE SEEN   Squamous Epithelial / LPF 6-30 (A) NONE SEEN   Mucous PRESENT      MDM   1. UTI (lower urinary tract infection)    We'll place patient on Macrobid since pH is 5.5. Assuming due to the large amount of fluid intake occasional Azo and cranberry is reasonable not seen a more abnormal urine. Will culture  urine. Diflucan since she does get yeast infection and will also place her on Pyridium as well for the discomfort. Follow-up PCP in 1-2 weeks as needed.   Note: This dictation was prepared with Dragon dictation along with smaller phrase technology. Any transcriptional errors that result from this process are unintentional.            Hassan RowanEugene Whitleigh Garramone, MD 04/06/16 [phone]

## 2016-04-06 NOTE — ED Notes (Signed)
Patient c/o burning when urinating since Monday.  Patient denies fevers.

## 2016-04-08 LAB — URINE CULTURE

## 2017-06-04 IMAGING — CR DG CHEST 1V
1 series · 1 of 1 positions shown · non-contrast
Comparison: None in PACs

CLINICAL DATA: Positive PPD, history of hypertension, asymptomatic
patient, nonsmoker.

EXAM:
CHEST 1 VIEW

[dg chest 1 view]
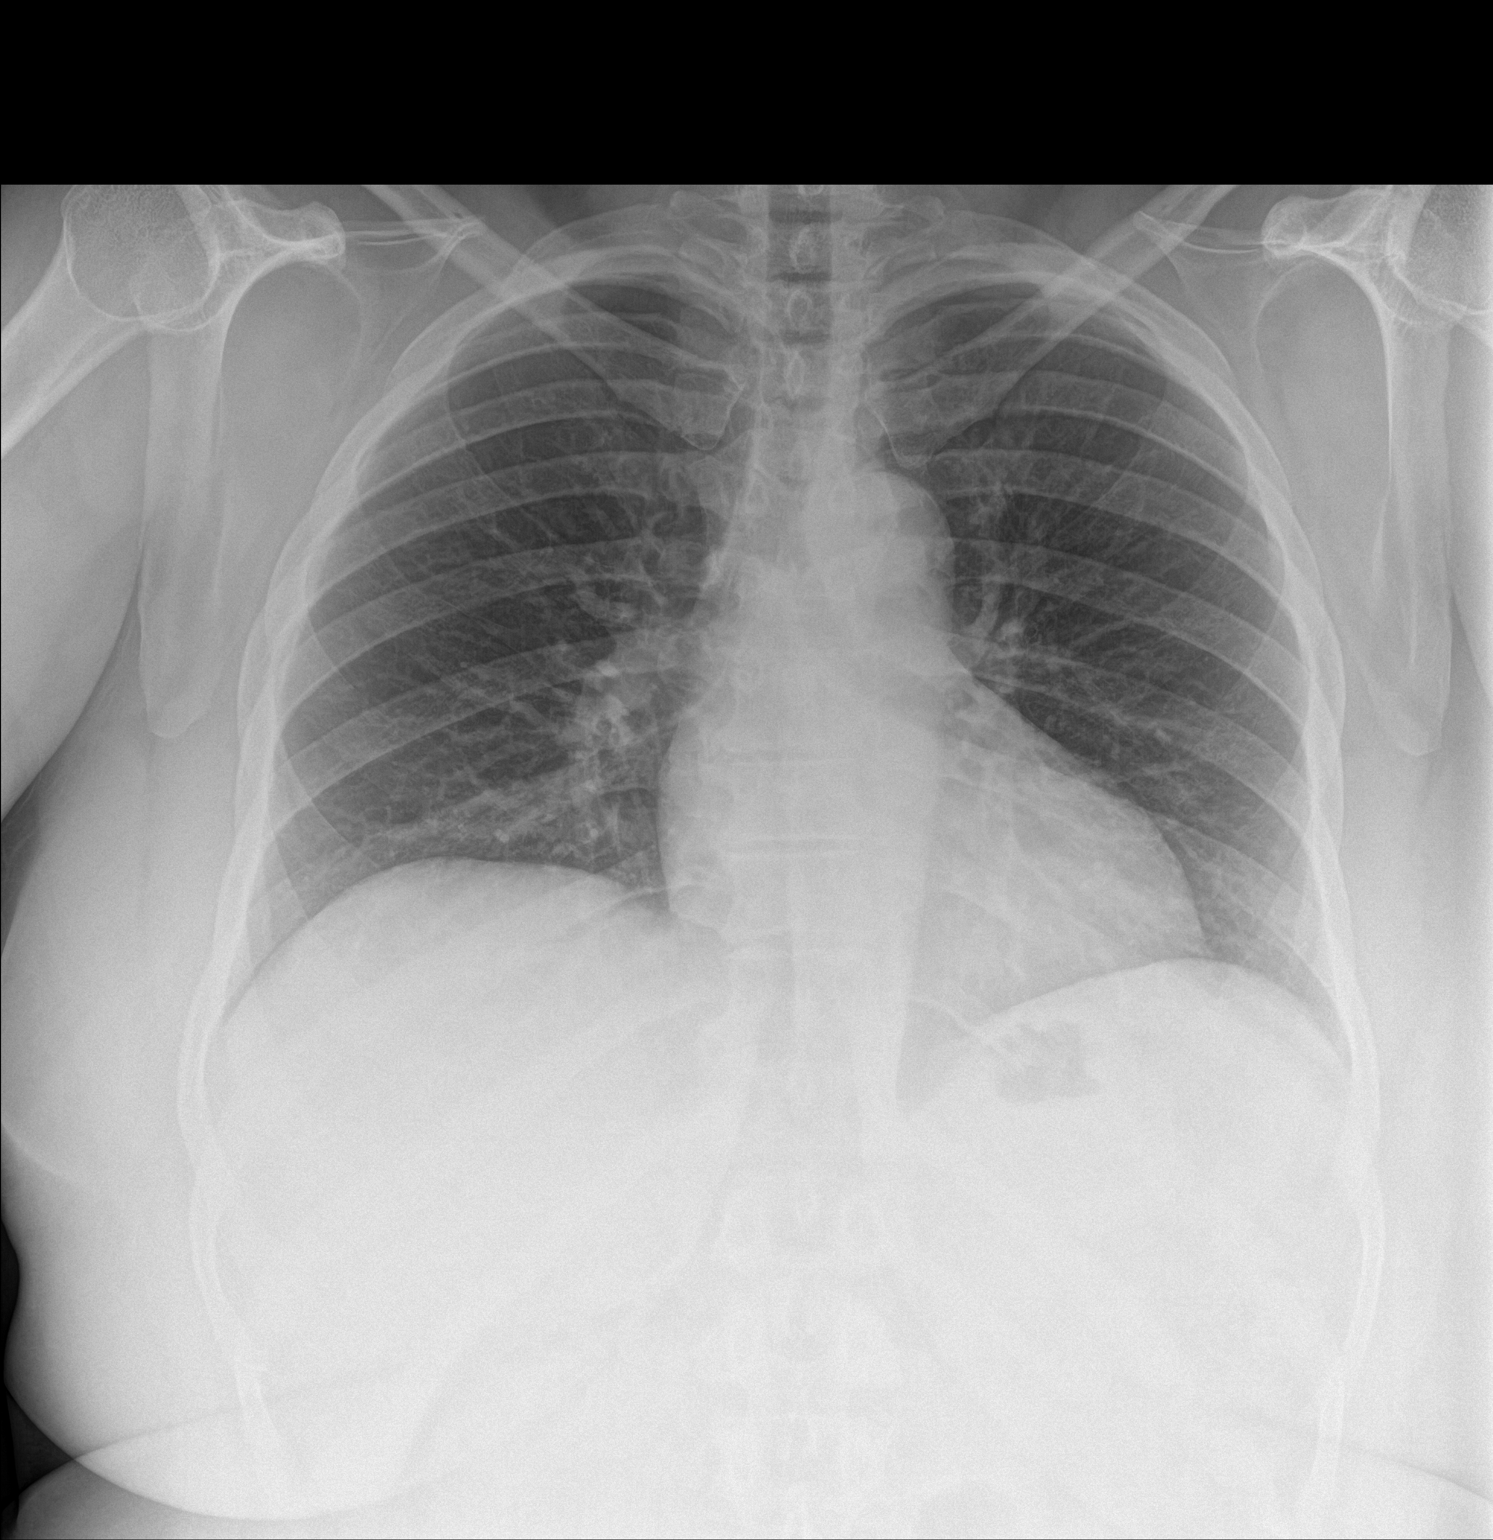

[1 of 1 positions shown; findings below may reference images not displayed]

FINDINGS: The lungs are mildly hypoinflated. There is no focal infiltrate.
There is no pleural effusion. The heart and pulmonary vascularity
are normal. The mediastinum is normal in width. The trachea is
midline. The bony thorax is unremarkable.
IMPRESSION: There is no evidence of acute or old tuberculous infection. The
study is somewhat limited due to mild hypoinflation.

## 2018-08-13 DIAGNOSIS — F32A Depression, unspecified: Secondary | ICD-10-CM | POA: Insufficient documentation

## 2018-08-13 DIAGNOSIS — E559 Vitamin D deficiency, unspecified: Secondary | ICD-10-CM | POA: Insufficient documentation

## 2018-08-13 DIAGNOSIS — G4700 Insomnia, unspecified: Secondary | ICD-10-CM | POA: Insufficient documentation

## 2019-06-16 DIAGNOSIS — N3941 Urge incontinence: Secondary | ICD-10-CM | POA: Insufficient documentation

## 2019-06-16 DIAGNOSIS — K219 Gastro-esophageal reflux disease without esophagitis: Secondary | ICD-10-CM | POA: Insufficient documentation

## 2019-12-16 ENCOUNTER — Ambulatory Visit
Admission: EM | Admit: 2019-12-16 | Discharge: 2019-12-16 | Disposition: A | Discharge status: Home / Self Care | Payer: BC Managed Care – PPO | Attending: Family Medicine | Admitting: Family Medicine

## 2019-12-16 ENCOUNTER — Encounter: Payer: Self-pay | Admitting: Emergency Medicine

## 2019-12-16 ENCOUNTER — Other Ambulatory Visit: Payer: Self-pay

## 2019-12-16 DIAGNOSIS — M62838 Other muscle spasm: Secondary | ICD-10-CM | POA: Diagnosis not present

## 2019-12-16 DIAGNOSIS — M7918 Myalgia, other site: Secondary | ICD-10-CM

## 2019-12-16 DIAGNOSIS — X500XXA Overexertion from strenuous movement or load, initial encounter: Secondary | ICD-10-CM | POA: Diagnosis not present

## 2019-12-16 HISTORY — DX: Polyneuropathy, unspecified: G62.9

## 2019-12-16 MED ORDER — KETOROLAC TROMETHAMINE 10 MG PO TABS: 10.0000 mg | ORAL_TABLET | Freq: Four times a day (QID) | ORAL | 0 refills | Status: DC | PRN

## 2019-12-16 MED ORDER — TIZANIDINE HCL 4 MG PO TABS: 4.0000 mg | ORAL_TABLET | Freq: Four times a day (QID) | ORAL | 0 refills | Status: DC | PRN

## 2019-12-16 NOTE — Discharge Instructions (Addendum)
Rest.  Heat.  Medication as prescribed.  If you continue to have pain or it worsens, please follow up with occupational health.   Take care  Dr. Adriana Simas

## 2019-12-16 NOTE — ED Triage Notes (Signed)
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 Patient is c/o pain in her shoulder all the way down into her hand.

## 2019-12-16 NOTE — ED Provider Notes (Signed)
[facility]    CSN: [id number] Arrival date & time: 12/16/19  1653      History   Chief Complaint Chief Complaint  Patient presents with  . Shoulder Pain    (left) DOI 12/15/2019  . Arm Pain   HPI 49 year old female presents with a Workmen's Comp. injury.  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  This occurred yesterday.  She states that she is having severe left arm pain.  She states that it is the entire left arm.  Pain is severe.  Was worse last night.  Interferes with sleep.  She has taken Motrin without relief.  Rates her pain as 10 out of 10.  No relieving factors.  No other complaints at this time.  Past Medical History:  Diagnosis Date  . Hypertension   . Neuropathy    Patient Active Problem List   Diagnosis Date Noted  . Plantar fasciitis 04/04/2015   Past Surgical History:  Procedure Laterality Date  . ABDOMINAL HYSTERECTOMY    . skin grafts     OB History   No obstetric history on file.    Home Medications    Prior to Admission medications   Medication Sig Start Date End Date Taking? Authorizing Provider  gabapentin (NEURONTIN) 300 MG capsule Take 300 mg by mouth 3 (three) times daily.   Yes [provider]  lisinopril (PRINIVIL,ZESTRIL) 10 MG tablet Take 10 mg by mouth daily.   Yes [provider]  traZODone (DESYREL) 50 MG tablet  03/11/15  Yes [provider]  ketorolac (TORADOL) 10 MG tablet Take 1 tablet (10 mg total) by mouth every 6 (six) hours as needed for moderate pain or severe pain. 12/16/19   Coral Spikes, DO  tiZANidine (ZANAFLEX) 4 MG tablet Take 1 tablet (4 mg total) by mouth every 6 (six) hours as needed for muscle spasms. 12/16/19   Coral Spikes, DO  carbamazepine (TEGRETOL) 200 MG tablet  02/16/15 12/16/19  [provider]  fluticasone (FLONASE) 50 MCG/ACT nasal spray Place 2 sprays into both nostrils daily.  12/30/15 12/16/19  Harrie Foreman, MD    Family History Family History  Problem Relation Age of Onset  . Diabetes Mother   . Cancer Mother   . Diabetes Father   . Cancer Father     Social History Social History   Tobacco Use  . Smoking status: Never Smoker  . Smokeless tobacco: Never Used  Substance Use Topics  . Alcohol use: No    Alcohol/week: 0.0 standard drinks  . Drug use: Never     Allergies   Percocet [oxycodone-acetaminophen]   Review of Systems Review of Systems  Constitutional: Negative.   Musculoskeletal:       Left arm pain.   Physical Exam Triage Vital Signs ED Triage Vitals  Enc Vitals Group     BP 12/16/19 1735 (!) 165/98     Pulse Rate 12/16/19 1735 79     Resp 12/16/19 1735 18     Temp 12/16/19 1735 98.4 F (36.9 C)     Temp Source 12/16/19 1735 Oral     SpO2 12/16/19 1735 99 %     Weight 12/16/19 1732 243 lb (110.2 kg)     Height 12/16/19 1732 5\' 4"  (1.626 m)     Head Circumference --      Peak Flow --  Pain Score 12/16/19 1732 10     Pain Loc --      Pain Edu? --      Excl. in GC? --    Updated Vital Signs BP (!) 165/98 (BP Location: Right Arm)   Pulse 79   Temp 98.4 F (36.9 C) (Oral)   Resp 18   Ht 5\' 4"  (1.626 m)   Wt 110.2 kg   SpO2 99%   BMI 41.71 kg/m   Visual Acuity Right Eye Distance:   Left Eye Distance:   Bilateral Distance:    Right Eye Near:   Left Eye Near:    Bilateral Near:     Physical Exam Constitutional:      General: She is not in acute distress.    Appearance: Normal appearance. She is obese. She is not ill-appearing.  HENT:     Head: Normocephalic and atraumatic.  Eyes:     General:        Right eye: No discharge.        Left eye: No discharge.     Conjunctiva/sclera: Conjunctivae normal.  Cardiovascular:     Rate and Rhythm: Normal rate and regular rhythm.     Heart sounds: No murmur.  Pulmonary:     Effort: Pulmonary effort is normal.     Breath sounds: Normal breath sounds. No  wheezing, rhonchi or rales.  Musculoskeletal:     Comments: Left upper extremity -trapezius muscle spasm noted.  Patient with exquisite tenderness over the anterior shoulder.  Decreased range of motion secondary to pain.  Neurological:     Mental Status: She is alert.  Psychiatric:        Mood and Affect: Mood normal.        Behavior: Behavior normal.    UC Treatments / Results  Labs (all labs ordered are listed, but only abnormal results are displayed) Labs Reviewed - No data to display  EKG   Radiology No results found.  Procedures Procedures (including critical care time)  Medications Ordered in UC Medications - No data to display  Initial Impression / Assessment and Plan / UC Course  I have reviewed the triage vital signs and the nursing notes.  Pertinent labs & imaging results that were available during my care of the patient were reviewed by me and considered in my medical decision making (see chart for details).    49 year old female presents with musculoskeletal pain.  There is no indication for imaging.  There is no evidence of fracture.  Patient has trapezius muscle spasm on exam.  She is exquisitely tender to palpation with minimal pressure/force.  Placing on Zanaflex and Toradol.  Workmen's Comp. form filled out.  May return to work on Friday.  Final Clinical Impressions(s) / UC Diagnoses   Final diagnoses:  Trapezius muscle spasm  Musculoskeletal pain     Discharge Instructions     Rest.  Heat.  Medication as prescribed.  If you continue to have pain or it worsens, please follow up with occupational health.   Take care  Dr. Wednesday    ED Prescriptions    Medication Sig Dispense Auth. Provider   tiZANidine (ZANAFLEX) 4 MG tablet Take 1 tablet (4 mg total) by mouth every 6 (six) hours as needed for muscle spasms. 30 tablet Markea Ruzich G, DO   ketorolac (TORADOL) 10 MG tablet Take 1 tablet (10 mg total) by mouth every 6 (six) hours as needed for  moderate pain or severe pain. 20 tablet Firth,  Verdis Frederickson, DO     PDMP not reviewed this encounter.   Tommie Sams, [place] 12/16/19 1803

## 2020-05-25 LAB — HM COLONOSCOPY

## 2022-06-29 DIAGNOSIS — D485 Neoplasm of uncertain behavior of skin: Secondary | ICD-10-CM | POA: Diagnosis not present

## 2022-06-29 DIAGNOSIS — D044 Carcinoma in situ of skin of scalp and neck: Secondary | ICD-10-CM | POA: Diagnosis not present

## 2022-06-29 DIAGNOSIS — R52 Pain, unspecified: Secondary | ICD-10-CM | POA: Diagnosis not present

## 2022-07-31 ENCOUNTER — Ambulatory Visit
Admission: EM | Admit: 2022-07-31 | Discharge: 2022-07-31 | Disposition: A | Discharge status: Home / Self Care | Payer: 59 | Attending: Internal Medicine | Admitting: Internal Medicine

## 2022-07-31 ENCOUNTER — Encounter: Payer: Self-pay | Admitting: Emergency Medicine

## 2022-07-31 DIAGNOSIS — M65052 Abscess of tendon sheath, left thigh: Secondary | ICD-10-CM | POA: Diagnosis not present

## 2022-07-31 DIAGNOSIS — L03116 Cellulitis of left lower limb: Secondary | ICD-10-CM | POA: Diagnosis not present

## 2022-07-31 MED ORDER — DOXYCYCLINE HYCLATE 100 MG PO CAPS: 100.0000 mg | ORAL_CAPSULE | Freq: Two times a day (BID) | ORAL | 0 refills | Status: DC

## 2022-07-31 MED ORDER — CEFTRIAXONE SODIUM 1 G IJ SOLR
1.0000 g | Freq: Once | INTRAMUSCULAR | Status: AC
  Administered 2022-07-31: 1 g via INTRAMUSCULAR

## 2022-07-31 NOTE — ED Triage Notes (Signed)
Pt has abscess on her left upper thigh. Started about 3 days ago, but has gotten worse.

## 2022-07-31 NOTE — Discharge Instructions (Signed)
Sent your PCP your BP recordings so your medication can be changed

## 2022-07-31 NOTE — ED Provider Notes (Signed)
[facility]    CSN: [id number] Arrival date & time: 07/31/22  1628      History   Chief Complaint Chief Complaint  Patient presents with   Abscess    HPI Heidi Kline is a 51 y.o. female who started noticing a sore area on her L upper thigh and though she had bruise from work, but the next day noticed increased redness and got worse today. Has had patient with MRSA. Denies being anywhere where she could have gotten stung by something. Area is painful, and not itchy. Denies hx of staph or MRSA    Past Medical History:  Diagnosis Date   Hypertension    Neuropathy     Patient Active Problem List   Diagnosis Date Noted   Plantar fasciitis 04/04/2015    Past Surgical History:  Procedure Laterality Date   ABDOMINAL HYSTERECTOMY     skin grafts      OB History   No obstetric history on file.      Home Medications    Prior to Admission medications   Medication Sig Start Date End Date Taking? Authorizing Provider  doxycycline (VIBRAMYCIN) 100 MG capsule Take 1 capsule (100 mg total) by mouth 2 (two) times daily. 07/31/22  Yes Rodriguez-Southworth, Sunday Spillers, PA-C  gabapentin (NEURONTIN) 300 MG capsule Take 300 mg by mouth 3 (three) times daily.   Yes [provider]  lisinopril (PRINIVIL,ZESTRIL) 10 MG tablet Take 10 mg by mouth daily.   Yes [provider]  traZODone (DESYREL) 50 MG tablet  03/11/15  Yes [provider]  ketorolac (TORADOL) 10 MG tablet Take 1 tablet (10 mg total) by mouth every 6 (six) hours as needed for moderate pain or severe pain. 12/16/19   Coral Spikes, DO  tiZANidine (ZANAFLEX) 4 MG tablet Take 1 tablet (4 mg total) by mouth every 6 (six) hours as needed for muscle spasms. 12/16/19   Coral Spikes, DO  carbamazepine (TEGRETOL) 200 MG tablet  02/16/15 12/16/19  [provider]  fluticasone (FLONASE) 50 MCG/ACT nasal spray Place 2 sprays into both nostrils daily. 12/30/15 12/16/19  Harrie Foreman, MD     Family History Family History  Problem Relation Age of Onset   Diabetes Mother    Cancer Mother    Diabetes Father    Cancer Father     Social History Social History   Tobacco Use   Smoking status: Never   Smokeless tobacco: Never  Vaping Use   Vaping Use: Never used  Substance Use Topics   Alcohol use: No    Alcohol/week: 0.0 standard drinks of alcohol   Drug use: Never     Allergies   Percocet [oxycodone-acetaminophen]   Review of Systems Review of Systems  Constitutional:  Negative for chills and fever.  Cardiovascular:        Has hx of HTN and her PCP is monitoring. She is at max Lisinopril.   Skin:  Positive for color change and wound.     Physical Exam Triage Vital Signs ED Triage Vitals  Enc Vitals Group     BP 07/31/22 1654 (!) 178/91     Pulse Rate 07/31/22 1654 83     Resp 07/31/22 1654 16     Temp 07/31/22 1654 98.4 F (36.9 C)     Temp Source 07/31/22 1654 Oral     SpO2 07/31/22 1654 100 %     Weight 07/31/22 1651 242 lb 15.2 oz (110.2 kg)  Height 07/31/22 1651 5\' 4"  (1.626 m)     Head Circumference --      Peak Flow --      Pain Score 07/31/22 1650 5     Pain Loc --      Pain Edu? --      Excl. in GC? --    No data found.  Updated Vital Signs BP (!) 178/91 (BP Location: Left Arm)   Pulse 83   Temp 98.4 F (36.9 C) (Oral)   Resp 16   Ht 5\' 4"  (1.626 m)   Wt 242 lb 15.2 oz (110.2 kg)   SpO2 100%   BMI 41.70 kg/m   Visual Acuity Right Eye Distance:   Left Eye Distance:   Bilateral Distance:    Right Eye Near:   Left Eye Near:    Bilateral Near:     Physical Exam Vitals and nursing note reviewed.  Constitutional:      General: She is not in acute distress.    Appearance: She is obese. She is not toxic-appearing.  HENT:     Right Ear: External ear normal.     Left Ear: External ear normal.  Eyes:     General: No scleral icterus.    Conjunctiva/sclera: Conjunctivae normal.  Pulmonary:     Effort: Pulmonary  effort is normal.  Musculoskeletal:        General: Normal range of motion.     Cervical back: Neck supple.  Skin:    Findings: Erythema present.     Comments: L THIGH- with 1/2 cm x 1/2 white pustule like area which I opened for a culture and only had clear fluid. Has induration of about 1.24 cm x 1.25 cm. Has erythema around it and cellulitis changes of about 10 cm x 8 cm, but no streaking to her thigh. Area is tender.   Neurological:     Mental Status: She is alert and oriented to person, place, and time.     Gait: Gait normal.  Psychiatric:        Mood and Affect: Mood normal.        Behavior: Behavior normal.        Thought Content: Thought content normal.        Judgment: Judgment normal.      UC Treatments / Results  Labs (all labs ordered are listed, but only abnormal results are displayed) Labs Reviewed  AEROBIC CULTURE W GRAM STAIN (SUPERFICIAL SPECIMEN)    EKG   Radiology No results found.  Procedures Procedures (including critical care time)  Medications Ordered in UC Medications  cefTRIAXone (ROCEPHIN) injection 1 g (has no administration in time range)    Initial Impression / Assessment and Plan / UC Course  I have reviewed the triage vital signs and the nursing notes.  L thigh cellulitis  Abscess L thigh   She was given Rocephin 1 G IM here  and placed her on Doxy as noted.  Wound culture sent out.    Final Clinical Impressions(s) / UC Diagnoses   Final diagnoses:  Abscess of tendon of thigh, left  Cellulitis of left thigh     Discharge Instructions      Sent your PCP your BP recordings so your medication can be changed     ED Prescriptions     Medication Sig Dispense Auth. Provider   doxycycline (VIBRAMYCIN) 100 MG capsule Take 1 capsule (100 mg total) by mouth 2 (two) times daily. 20 capsule Rodriguez-Southworth, 08/02/22,  PDMP not reviewed this encounter.   Garey Ham, [place] 07/31/22 1731

## 2022-08-04 DIAGNOSIS — C44222 Squamous cell carcinoma of skin of right ear and external auricular canal: Secondary | ICD-10-CM | POA: Diagnosis not present

## 2022-08-04 LAB — AEROBIC CULTURE W GRAM STAIN (SUPERFICIAL SPECIMEN)
Culture: NO GROWTH
Gram Stain: NONE SEEN

## 2022-08-18 ENCOUNTER — Other Ambulatory Visit: Payer: Self-pay | Admitting: Specialist

## 2022-08-18 DIAGNOSIS — Z1231 Encounter for screening mammogram for malignant neoplasm of breast: Secondary | ICD-10-CM

## 2022-09-05 ENCOUNTER — Ambulatory Visit
Admission: RE | Admit: 2022-09-05 | Discharge: 2022-09-05 | Disposition: A | Discharge status: Home / Self Care | Payer: 59 | Source: Ambulatory Visit | Attending: Specialist | Admitting: Specialist

## 2022-09-05 DIAGNOSIS — Z1231 Encounter for screening mammogram for malignant neoplasm of breast: Secondary | ICD-10-CM | POA: Insufficient documentation

## 2022-09-14 ENCOUNTER — Ambulatory Visit
Admission: EM | Admit: 2022-09-14 | Discharge: 2022-09-14 | Disposition: A | Discharge status: Other Acute Inpatient Hospital | Payer: 59 | Attending: Family Medicine | Admitting: Family Medicine

## 2022-09-14 DIAGNOSIS — R519 Headache, unspecified: Secondary | ICD-10-CM | POA: Diagnosis not present

## 2022-09-14 DIAGNOSIS — R0989 Other specified symptoms and signs involving the circulatory and respiratory systems: Secondary | ICD-10-CM | POA: Diagnosis not present

## 2022-09-14 DIAGNOSIS — R42 Dizziness and giddiness: Secondary | ICD-10-CM | POA: Diagnosis not present

## 2022-09-14 DIAGNOSIS — I160 Hypertensive urgency: Secondary | ICD-10-CM

## 2022-09-14 DIAGNOSIS — R0789 Other chest pain: Secondary | ICD-10-CM | POA: Diagnosis not present

## 2022-09-14 DIAGNOSIS — R079 Chest pain, unspecified: Secondary | ICD-10-CM | POA: Diagnosis not present

## 2022-09-14 DIAGNOSIS — I10 Essential (primary) hypertension: Secondary | ICD-10-CM | POA: Diagnosis not present

## 2022-09-14 NOTE — ED Triage Notes (Signed)
Patient reports that her BP has been elevated, dizzy. Patient reports that she hasn't slept.

## 2022-09-14 NOTE — ED Notes (Signed)
Patient is being discharged from the Urgent Care and sent to the Emergency Department via POV . Per Dr. Rachael Darby, patient is in need of higher level of care due to Hypertensive. Patient is aware and verbalizes understanding of plan of care.  Vitals:   09/14/22 1814  BP: (S) (!) 217/117  Pulse: 66  Temp: 98.3 F (36.8 C)  SpO2: 100%

## 2022-09-14 NOTE — ED Provider Notes (Addendum)
[facility]    CSN: [id number] Arrival date & time: 09/14/22  1808      History   Chief Complaint Chief Complaint  Patient presents with   Hypertension    HPI Heidi Kline is a 51 y.o. female.   HPI  Heidi Kline presents for elevated blood pressures with lightheadedness.  Reports blood pressures at home were: 236/120, 234/111, 201/96, and 200/98.  She takes 10 mg of lisinopril.  Denies missing any dosing.  She is afraid that she may have heart failure as she has had several family members die of this.  Denies any orthopnea.  States she has been having headaches, blurry vision and palpitations.  There has been no leg swelling.  Denies chest pain and shortness of breath.   She works 2 jobs and has been under more stress recently.  She knows she should be taking better care of herself but has been looking out for others.     Past Medical History:  Diagnosis Date   Hypertension    Neuropathy     Patient Active Problem List   Diagnosis Date Noted   Plantar fasciitis 04/04/2015    Past Surgical History:  Procedure Laterality Date   ABDOMINAL HYSTERECTOMY     skin grafts      OB History   No obstetric history on file.      Home Medications    Prior to Admission medications   Medication Sig Start Date End Date Taking? Authorizing Provider  doxycycline (VIBRAMYCIN) 100 MG capsule Take 1 capsule (100 mg total) by mouth 2 (two) times daily. 07/31/22   Rodriguez-Southworth, Nettie Elm, PA-C  gabapentin (NEURONTIN) 300 MG capsule Take 300 mg by mouth 3 (three) times daily.    [provider]  ketorolac (TORADOL) 10 MG tablet Take 1 tablet (10 mg total) by mouth every 6 (six) hours as needed for moderate pain or severe pain. 12/16/19   Tommie Sams, DO  lisinopril (PRINIVIL,ZESTRIL) 10 MG tablet Take 10 mg by mouth daily.    [provider]  tiZANidine (ZANAFLEX) 4 MG tablet Take 1 tablet (4 mg total) by mouth every 6 (six) hours as needed for  muscle spasms. 12/16/19   Tommie Sams, DO  traZODone (DESYREL) 50 MG tablet  03/11/15   [provider]  carbamazepine (TEGRETOL) 200 MG tablet  02/16/15 12/16/19  [provider]  fluticasone (FLONASE) 50 MCG/ACT nasal spray Place 2 sprays into both nostrils daily. 12/30/15 12/16/19  Arnaldo Natal, MD    Family History Family History  Problem Relation Age of Onset   Diabetes Mother    Cancer Mother    Diabetes Father    Cancer Father    Breast cancer Maternal Aunt    Breast cancer Paternal Aunt     Social History Social History   Tobacco Use   Smoking status: Never   Smokeless tobacco: Never  Vaping Use   Vaping Use: Never used  Substance Use Topics   Alcohol use: No    Alcohol/week: 0.0 standard drinks of alcohol   Drug use: Never     Allergies   Percocet [oxycodone-acetaminophen]   Review of Systems Review of Systems : negative unless otherwise stated in HPI.      Physical Exam Triage Vital Signs ED Triage Vitals  Enc Vitals Group     BP 09/14/22 1814 (S) (!) 217/117     Pulse Rate 09/14/22 1814 66     Resp --  Temp 09/14/22 1814 98.3 F (36.8 C)     Temp Source 09/14/22 1814 Oral     SpO2 09/14/22 1814 100 %     Weight 09/14/22 1813 242 lb 15.2 oz (110.2 kg)     Height 09/14/22 1813 5\' 4"  (1.626 m)     Head Circumference --      Peak Flow --      Pain Score 09/14/22 1813 0     Pain Loc --      Pain Edu? --      Excl. in GC? --    No data found.  Updated Vital Signs BP (S) (!) 217/117 (BP Location: Left Arm)   Pulse 66   Temp 98.3 F (36.8 C) (Oral)   Ht 5\' 4"  (1.626 m)   Wt 110.2 kg   SpO2 100%   BMI 41.70 kg/m   Visual Acuity Right Eye Distance:   Left Eye Distance:   Bilateral Distance:    Right Eye Near:   Left Eye Near:    Bilateral Near:     Physical Exam  GEN: alert, tearful female, in no acute distress     EYES:   pupils equal and reactive, EOM intact NECK:  supple, normal ROM RESP:  clear to  auscultation bilaterally, no increased work of breathing   CVS:   regular rate and rhythm, no murmur, distal pulses intact, no JVP ABD:  soft, non-tender; bowel sounds present; no palpable masses EXT:   normal ROM, atraumatic, no lower extremity edema   NEURO:  normal without focal findings,  speech normal, alert and oriented   Skin:   warm and dry, no rash   UC Treatments / Results  Labs (all labs ordered are listed, but only abnormal results are displayed) Labs Reviewed - No data to display  EKG   Radiology No results found.  Procedures Procedures (including critical care time)  Medications Ordered in UC Medications - No data to display  Initial Impression / Assessment and Plan / UC Course  I have reviewed the triage vital signs and the nursing notes.  Pertinent labs & imaging results that were available during my care of the patient were reviewed by me and considered in my medical decision making (see chart for details).     Patient is a 51 year old female with history of hypertension and neuropathy who presents for elevated blood pressures systolically 200s over 100s.  Blood pressure here significantly elevated 217/117.  Given her palpitations, headaches and blurry vision she may be endorgan damage from this and was advised to go to the emergency department for hypertensive urgency.  Recommend that she travel by EMS however she declined.  She will drive to [facility] emergency department.    Final Clinical Impressions(s) / UC Diagnoses   Final diagnoses:  Hypertensive urgency     Discharge Instructions      You have been advised to follow up immediately in the emergency department for concerning signs or symptoms as discussed during your visit. If you declined EMS transport, please have a family member take you directly to the ED at this time. Do not delay.   Based on concerns about condition, if you do not follow up in the ED, you may risk poor outcomes  including worsening of condition, delayed treatment and potentially life threatening issues. If you have declined to go to the ED at this time, you should call your PCP immediately to set up a follow up appointment.   Go  to ED for red flag symptoms, including; fevers you cannot reduce with Tylenol/Motrin, severe headaches, vision changes, numbness/weakness in part of the body, lethargy, confusion, intractable vomiting, severe dehydration, chest pain, breathing difficulty, severe persistent abdominal or pelvic pain, signs of severe infection (increased redness, swelling of an area), feeling faint or passing out, dizziness, etc. You should especially go to the ED for sudden acute worsening of condition if you do not elect to go at this time.       ED Prescriptions   None    PDMP not reviewed this encounter.   Katha Cabal, DO 09/14/22 2117    Katha Cabal, DO 09/14/22 2118

## 2022-09-14 NOTE — Discharge Instructions (Signed)

## 2022-09-15 DIAGNOSIS — R42 Dizziness and giddiness: Secondary | ICD-10-CM | POA: Diagnosis not present

## 2022-09-16 DIAGNOSIS — R42 Dizziness and giddiness: Secondary | ICD-10-CM | POA: Diagnosis not present

## 2022-09-19 ENCOUNTER — Other Ambulatory Visit: Payer: Self-pay | Admitting: *Deleted

## 2022-09-19 ENCOUNTER — Inpatient Hospital Stay
Admission: RE | Admit: 2022-09-19 | Discharge: 2022-09-19 | Disposition: A | Discharge status: Home / Self Care | Payer: Self-pay | Source: Ambulatory Visit | Attending: *Deleted | Admitting: *Deleted

## 2022-09-19 DIAGNOSIS — Z1231 Encounter for screening mammogram for malignant neoplasm of breast: Secondary | ICD-10-CM

## 2023-01-17 NOTE — Progress Notes (Deleted)
New patient visit   Patient: Heidi Kline   DOB: November 02, 1971   52 y.o. Female  MRN: [id number] Visit Date: 01/18/2023  Today's healthcare provider: Eulis Foster, MD   No chief complaint on file.  Subjective    Heidi Kline is a 52 y.o. female who presents today as a new patient to establish care.  HPI  ***  Past Medical History:  Diagnosis Date   Hypertension    Neuropathy    Past Surgical History:  Procedure Laterality Date   ABDOMINAL HYSTERECTOMY     skin grafts     Family Status  Relation Name Status   Mother  Alive   Father  Alive   Mat Aunt  (Not Specified)   Ethlyn Daniels  (Not Specified)   Family History  Problem Relation Age of Onset   Diabetes Mother    Cancer Mother    Diabetes Father    Cancer Father    Breast cancer Maternal Aunt    Breast cancer Paternal Aunt    Social History   Socioeconomic History   Marital status: Divorced    Spouse name: Not on file   Number of children: Not on file   Years of education: Not on file   Highest education level: Not on file  Occupational History   Not on file  Tobacco Use   Smoking status: Never   Smokeless tobacco: Never  Vaping Use   Vaping Use: Never used  Substance and Sexual Activity   Alcohol use: No    Alcohol/week: 0.0 standard drinks of alcohol   Drug use: Never   Sexual activity: Not on file  Other Topics Concern   Not on file  Social History Narrative   Not on file   Social Determinants of Health   Financial Resource Strain: Not on file  Food Insecurity: Not on file  Transportation Needs: Not on file  Physical Activity: Not on file  Stress: Not on file  Social Connections: Not on file   Outpatient Medications Prior to Visit  Medication Sig   doxycycline (VIBRAMYCIN) 100 MG capsule Take 1 capsule (100 mg total) by mouth 2 (two) times daily.   gabapentin (NEURONTIN) 300 MG capsule Take 300 mg by mouth 3 (three) times daily.   ketorolac (TORADOL) 10 MG  tablet Take 1 tablet (10 mg total) by mouth every 6 (six) hours as needed for moderate pain or severe pain.   lisinopril (PRINIVIL,ZESTRIL) 10 MG tablet Take 10 mg by mouth daily.   tiZANidine (ZANAFLEX) 4 MG tablet Take 1 tablet (4 mg total) by mouth every 6 (six) hours as needed for muscle spasms.   traZODone (DESYREL) 50 MG tablet    No facility-administered medications prior to visit.   Allergies  Allergen Reactions   Percocet [Oxycodone-Acetaminophen] Other (See Comments)    Upsets stomach      There is no immunization history on file for this patient.  Health Maintenance  Topic Date Due   HIV Screening  Never done   Hepatitis C Screening  Never done   DTaP/Tdap/Td (1 - Tdap) Never done   PAP SMEAR-Modifier  Never done   COLONOSCOPY (Pts 45-109yr Insurance coverage will need to be confirmed)  Never done   Zoster Vaccines- Shingrix (1 of 2) Never done   INFLUENZA VACCINE  06/06/2022   COVID-19 Vaccine (2 - 2023-24 season) 07/07/2022   MAMMOGRAM  09/05/2024   HPV VACCINES  Aged Out    Patient Care Team:  Juanell Fairly, MD (Inactive) as PCP - General (Pediatrics)  Review of Systems  {Labs  Heme  Chem  Endocrine  Serology  Results Review (optional):23779}   Objective    There were no vitals taken for this visit. {Show previous vital signs (optional):23777}  Physical Exam ***  Depression Screen     No data to display         No results found for any visits on 01/18/23.  Assessment & Plan     ***  No follow-ups on file.     {provider attestation***:1}   Eulis Foster, MD  [facility] [phone] (phone) [phone] (fax)  [place]

## 2023-01-18 ENCOUNTER — Ambulatory Visit: Payer: Self-pay | Admitting: Family Medicine

## 2023-02-27 ENCOUNTER — Ambulatory Visit
Admission: EM | Admit: 2023-02-27 | Discharge: 2023-02-27 | Disposition: A | Discharge status: Home / Self Care | Payer: BC Managed Care – PPO | Attending: Emergency Medicine | Admitting: Emergency Medicine

## 2023-02-27 ENCOUNTER — Other Ambulatory Visit: Payer: Self-pay

## 2023-02-27 DIAGNOSIS — M545 Low back pain, unspecified: Secondary | ICD-10-CM | POA: Insufficient documentation

## 2023-02-27 LAB — URINALYSIS, ROUTINE W REFLEX MICROSCOPIC
Bilirubin Urine: NEGATIVE
Glucose, UA: NEGATIVE mg/dL
Hgb urine dipstick: NEGATIVE
Ketones, ur: NEGATIVE mg/dL
Leukocytes,Ua: NEGATIVE
Nitrite: NEGATIVE
Protein, ur: NEGATIVE mg/dL
Specific Gravity, Urine: >1.03 — ABNORMAL HIGH (ref 1.005–1.030)
pH: 5.5 (ref 5.0–8.0)

## 2023-02-27 MED ORDER — PREDNISONE 20 MG PO TABS: 40.0000 mg | ORAL_TABLET | Freq: Every day | ORAL | 0 refills | Status: DC

## 2023-02-27 MED ORDER — KETOROLAC TROMETHAMINE 30 MG/ML IJ SOLN
30.0000 mg | Freq: Once | INTRAMUSCULAR | Status: AC
  Administered 2023-02-27: 30 mg via INTRAMUSCULAR

## 2023-02-27 MED ORDER — TIZANIDINE HCL 4 MG PO TABS: 4.0000 mg | ORAL_TABLET | Freq: Four times a day (QID) | ORAL | 0 refills | Status: DC | PRN

## 2023-02-27 NOTE — Discharge Instructions (Addendum)
Today you have been evaluated for back pain, will move forward with muscular treatment  Urinalysis negative for infection, if you continue to have urinary frequency or begin to have new urinary or vaginal symptoms please follow-up for reevaluation and at that time I would recommend testing for BV and yeast  You have been given an injection of Toradol daily symptoms will begin to reduce in approximately 30 minutes starting tomorrow take prednisone every morning with his inflammation to manage your pain, take Tylenol in addition to this  May continue use of Zanaflex every 6 hours as needed for additional comfort, be mindful can make you feel drowsy  You may use ice or heat In 10 to 15-minute intervals  May Massage and stretch as tolerated  You may continue use of compression if providing comfort  May follow-up with urgent care, your primary doctor or orthopedic specialist if symptoms continue to persist or worsen

## 2023-02-27 NOTE — ED Triage Notes (Signed)
Pt c/o low back pain x 2 days. Urinary frequency. Denies fever

## 2023-02-27 NOTE — ED Provider Notes (Signed)
[facility]    CSN: [id number] Arrival date & time: 02/27/23  1351      History   Chief Complaint Chief Complaint  Patient presents with   Back Pain    HPI Heidi Kline is a 52 y.o. female.   Patient presents for evaluation of constant right-sided low back pain and urinary frequency beginning 2 days ago.  Cough is described as sharp, throbbing pain to shoot from the bottom of the lip to the top.  Symptoms can be felt with all movement and currently unable to bend.  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  Denies numbness or tingling urinary or bowel incontinence, hematuria, dysuria, lower abdominal pain or pressure.  Has attempted use of ibuprofen, Tylenol and muscle relaxant.,  Past Medical History:  Diagnosis Date   Hypertension    Neuropathy     Patient Active Problem List   Diagnosis Date Noted   Morbid (severe) obesity due to excess calories 01/18/2023   GERD (gastroesophageal reflux disease) 06/16/2019   Urge incontinence 06/16/2019   Depression 08/13/2018   Insomnia 08/13/2018   Vitamin D deficiency 08/13/2018   Plantar fasciitis 04/04/2015   Hypertension 12/04/2013   Neuropathy of foot 12/04/2013   Neuropathy of hand 12/04/2013    Past Surgical History:  Procedure Laterality Date   ABDOMINAL HYSTERECTOMY     skin grafts      OB History   No obstetric history on file.      Home Medications    Prior to Admission medications   Medication Sig Start Date End Date Taking? Authorizing Provider  acyclovir (ZOVIRAX) 400 MG tablet Take 400 mg by mouth 3 (three) times daily.    [provider]  gabapentin (NEURONTIN) 300 MG capsule Take 300 mg by mouth 3 (three) times daily.    [provider]  ketorolac (TORADOL) 10 MG tablet Take 1 tablet (10 mg total) by mouth every 6 (six) hours as needed for moderate pain or severe pain. 12/16/19   Tommie Sams, DO   lisinopril (PRINIVIL,ZESTRIL) 10 MG tablet Take 10 mg by mouth daily.    [provider]  tiZANidine (ZANAFLEX) 4 MG tablet Take 1 tablet (4 mg total) by mouth every 6 (six) hours as needed for muscle spasms. 12/16/19   Tommie Sams, DO  traZODone (DESYREL) 50 MG tablet  03/11/15   [provider]  carbamazepine (TEGRETOL) 200 MG tablet  02/16/15 12/16/19  [provider]  fluticasone (FLONASE) 50 MCG/ACT nasal spray Place 2 sprays into both nostrils daily. 12/30/15 12/16/19  Arnaldo Natal, MD    Family History Family History  Problem Relation Age of Onset   Diabetes Mother    Cancer Mother    Diabetes Father    Cancer Father    Breast cancer Maternal Aunt    Breast cancer Paternal Aunt     Social History Social History   Tobacco Use   Smoking status: Never   Smokeless tobacco: Never  Vaping Use   Vaping Use: Never used  Substance Use Topics   Alcohol use: No    Alcohol/week: 0.0 standard drinks of alcohol   Drug use: Never     Allergies   Patient has no active allergies.   Review of Systems Review of Systems  Musculoskeletal:  Positive for back pain.     Physical Exam Triage Vital Signs ED Triage Vitals  Enc Vitals Group  BP 02/27/23 1442 (!) 183/91     Pulse Rate 02/27/23 1442 72     Resp 02/27/23 1442 20     Temp 02/27/23 1442 97.9 F (36.6 C)     Temp src --      SpO2 02/27/23 1442 99 %     Weight --      Height --      Head Circumference --      Peak Flow --      Pain Score 02/27/23 1439 10     Pain Loc --      Pain Edu? --      Excl. in GC? --    No data found.  Updated Vital Signs BP (!) 183/91 (BP Location: Left Arm)   Pulse 72   Temp 97.9 F (36.6 C)   Resp 20   SpO2 99%   Visual Acuity Right Eye Distance:   Left Eye Distance:   Bilateral Distance:    Right Eye Near:   Left Eye Near:    Bilateral Near:     Physical Exam Constitutional:      Appearance: Normal appearance.  Eyes:     Extraocular  Movements: Extraocular movements intact.  Pulmonary:     Effort: Pulmonary effort is normal.  Abdominal:     Tenderness: There is no right CVA tenderness or left CVA tenderness.  Musculoskeletal:     Comments: Tenderness is present over the right lower lumbar region without ecchymosis, swelling or deformity, positive straight leg test, able to twist and turn, limitations of bending  Neurological:     Mental Status: She is alert and oriented to person, place, and time. Mental status is at baseline.      UC Treatments / Results  Labs (all labs ordered are listed, but only abnormal results are displayed) Labs Reviewed  URINALYSIS, ROUTINE W REFLEX MICROSCOPIC - Abnormal; Notable for the following components:      Result Value   Specific Gravity, Urine >1.030 (*)    All other components within normal limits    EKG   Radiology No results found.  Procedures Procedures (including critical care time)  Medications Ordered in UC Medications - No data to display  Initial Impression / Assessment and Plan / UC Course  I have reviewed the triage vital signs and the nursing notes.  Pertinent labs & imaging results that were available during my care of the patient were reviewed by me and considered in my medical decision making (see chart for details).  Vital signs are stable patient is in no signs of distress nontoxic-appearing, urinalysis, discussed with patient, declined BV and yeast testing, Toradol injection given today in office and prescribed prednisone and Zanaflex for outpatient use, recommended RICE for supportive care with heat, massage stretching and activity as tolerated additionally, advised follow-up with PCP or orthopedics if symptoms continue to persist Final Clinical Impressions(s) / UC Diagnoses   Final diagnoses:  None   Discharge Instructions   None    ED Prescriptions   None    PDMP not reviewed this encounter.   Valinda Hoar, NP 02/27/23 1551

## 2023-03-13 ENCOUNTER — Ambulatory Visit: Payer: Self-pay | Admitting: Family Medicine
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
## 2023-03-29 ENCOUNTER — Ambulatory Visit: Payer: BC Managed Care – PPO | Admitting: Family Medicine

## 2023-03-29 ENCOUNTER — Encounter: Payer: Self-pay | Admitting: Family Medicine

## 2023-03-29 VITALS — BP 156/88 | HR 78 | Resp 16 | Ht 64.0 in | Wt 266.9 lb

## 2023-03-29 DIAGNOSIS — A609 Anogenital herpesviral infection, unspecified: Secondary | ICD-10-CM | POA: Diagnosis not present

## 2023-03-29 DIAGNOSIS — I10 Essential (primary) hypertension: Secondary | ICD-10-CM | POA: Diagnosis not present

## 2023-03-29 DIAGNOSIS — G5792 Unspecified mononeuropathy of left lower limb: Secondary | ICD-10-CM | POA: Diagnosis not present

## 2023-03-29 MED ORDER — ACYCLOVIR 400 MG PO TABS: 400.0000 mg | ORAL_TABLET | Freq: Two times a day (BID) | ORAL | 3 refills | Status: DC

## 2023-03-29 MED ORDER — AMLODIPINE BESYLATE 5 MG PO TABS
5.0000 mg | ORAL_TABLET | Freq: Every day | ORAL | 1 refills | Status: DC
Start: 2023-03-29 — End: 2023-09-19

## 2023-03-29 MED ORDER — LISINOPRIL-HYDROCHLOROTHIAZIDE 20-12.5 MG PO TABS
1.0000 | ORAL_TABLET | Freq: Every day | ORAL | 1 refills | Status: DC
Start: 2023-03-29 — End: 2023-09-19

## 2023-03-29 NOTE — Assessment & Plan Note (Signed)
Chronic Intermittent flares Patient prefers suppressive therapy Prescribed acyclovir 400 mg twice daily, 90-day supply with 1 refill

## 2023-03-29 NOTE — Assessment & Plan Note (Signed)
Chronic  BP goal is less than 130/80,  patient's BP is not at goal  Elevated in office with multiple measurements Medications adjustments include we will start lisinopril-hydrochlorothiazide 20-12.5 mg daily, also added amlodipine 5 mg Counseled patient to start lisinopril hydrochlorothiazide for 1 week and monitor blood pressure and if consistently elevated above 140/90 she should start the amlodipine 5 mg as well Patient voiced understanding CMP, CBC and TSH free T4 measured today Recommend checking BP at home 1-2 hours after medications  Follow up in 2 weeks

## 2023-03-29 NOTE — Patient Instructions (Addendum)
It was a pleasure meeting you today!  Welcome to [facility].  I look forward to taking part in your care as your new primary care physician.    Summary of our discussion today:   Please start the lisinopril-hctz combo pill daily for 1 week. If blood pressure is still greater than 140/90, add the amlodipine 5mg  daily  Please check your pressure 1-2 hours after your medications and keep a record for [place] to review at your next appointment  We will follow up with results of labs once they are available.  I have refilled the acyclovir for you to have for the next 6 months    Please remember to schedule your annual physical one year from your last physical.   You should return to our clinic in 2 weeks for blood pressure and nerve pain.   Best Wishes,   Dr. Roxan Hockey

## 2023-03-29 NOTE — Assessment & Plan Note (Signed)
Chronic Stable Will check CMP, CBC, TSH and free T4
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
## 2023-03-30 ENCOUNTER — Telehealth: Payer: Self-pay | Admitting: Family Medicine

## 2023-03-30 LAB — COMPREHENSIVE METABOLIC PANEL
ALT: 23 IU/L (ref 0–32)
AST: 24 IU/L (ref 0–40)
Albumin/Globulin Ratio: 2 (ref 1.2–2.2)
Albumin: 4.5 g/dL (ref 3.8–4.9)
Alkaline Phosphatase: 116 IU/L (ref 44–121)
BUN/Creatinine Ratio: 14 (ref 9–23)
BUN: 15 mg/dL (ref 6–24)
Bilirubin Total: 0.5 mg/dL (ref 0.0–1.2)
CO2: 26 mmol/L (ref 20–29)
Calcium: 9.7 mg/dL (ref 8.7–10.2)
Chloride: 103 mmol/L (ref 96–106)
Creatinine, Ser: 1.05 mg/dL — ABNORMAL HIGH (ref 0.57–1.00)
Globulin, Total: 2.2 g/dL (ref 1.5–4.5)
Glucose: 84 mg/dL (ref 70–99)
Potassium: 4 mmol/L (ref 3.5–5.2)
Sodium: 141 mmol/L (ref 134–144)
Total Protein: 6.7 g/dL (ref 6.0–8.5)
eGFR: 64 mL/min/{1.73_m2} (ref >59)

## 2023-03-30 LAB — CBC
Hematocrit: 37.1 % (ref 34.0–46.6)
Hemoglobin: 11.9 g/dL (ref 11.1–15.9)
MCH: 26.9 pg (ref 26.6–33.0)
MCHC: 32.1 g/dL (ref 31.5–35.7)
MCV: 84 fL (ref 79–97)
Platelets: 286 10*3/uL (ref 150–450)
RBC: 4.43 x10E6/uL (ref 3.77–5.28)
RDW: 12.6 % (ref 11.7–15.4)
WBC: 6.5 10*3/uL (ref 3.4–10.8)

## 2023-03-30 LAB — TSH+FREE T4
Free T4: 1.02 ng/dL (ref 0.82–1.77)
TSH: 1.89 u[IU]/mL (ref 0.450–4.500)

## 2023-04-05 ENCOUNTER — Ambulatory Visit: Payer: BC Managed Care – PPO | Admitting: Physician Assistant

## 2023-04-05 ENCOUNTER — Ambulatory Visit: Payer: Self-pay | Admitting: *Deleted

## 2023-04-05 VITALS — BP 117/73 | HR 77 | Temp 98.4°F | Resp 16 | Wt 260.7 lb

## 2023-04-05 DIAGNOSIS — I10 Essential (primary) hypertension: Secondary | ICD-10-CM | POA: Diagnosis not present

## 2023-04-05 NOTE — Progress Notes (Signed)
Established patient visit  I,Roshena L Chambers,acting as a scribe for [employer], PA-C.,have documented all relevant documentation on the behalf of Debera Lat, PA-C,as directed by  [employer], PA-C while in the presence of [employer], PA-C.    Patient: Heidi Kline   DOB: 01/21/1971   52 y.o. Female  MRN: [id number] Visit Date: 04/05/2023  Today's healthcare provider: Debera Lat, PA-C   Chief Complaint  Patient presents with   Hypertension   Subjective    HPI  Hypertension, follow-up  BP Readings from Last 3 Encounters:  04/05/23 117/73  03/29/23 (!) 156/88  02/27/23 (!) 183/91   Wt Readings from Last 3 Encounters:  04/05/23 260 lb 11.2 oz (118.3 kg)  03/29/23 266 lb 14.4 oz (121.1 kg)  09/14/22 242 lb 15.2 oz (110.2 kg)     She was last seen for hypertension 7 days ago.  BP at that visit was 156/88. Management since that visit includes starting lisinopril hydrochlorothiazide for 1 week and monitoring blood pressure.If consistently elevated above 140/90 she should start the amlodipine 5 mg as well.  She reports good compliance with treatment. She is having side effects.( Lightheaded, headaches and fatigue) She is following a Regular diet. She is not exercising. She does not smoke.  Use of agents associated with hypertension: none.   Outside blood pressures are 191/101 this morning. Symptoms: No chest pain No chest pressure  No palpitations No syncope  No dyspnea No orthopnea  No paroxysmal nocturnal dyspnea No lower extremity edema   Pertinent labs No results found for: "CHOL", "HDL", "LDLCALC", "LDLDIRECT", "TRIG", "CHOLHDL" Lab Results  Component Value Date   NA 141 03/29/2023   K 4.0 03/29/2023   CREATININE 1.05 (H) 03/29/2023   EGFR 64 03/29/2023   GLUCOSE 84 03/29/2023   TSH 1.890 03/29/2023     The 10-year ASCVD risk score (Arnett DK, et al., 2019) is:  2.9%  ---------------------------------------------------------------------------------------------------   Medications: Outpatient Medications Prior to Visit  Medication Sig   acyclovir (ZOVIRAX) 400 MG tablet Take 1 tablet (400 mg total) by mouth 2 (two) times daily.   amLODipine (NORVASC) 5 MG tablet Take 1 tablet (5 mg total) by mouth daily.   lisinopril-hydrochlorothiazide (ZESTORETIC) 20-12.5 MG tablet Take 1 tablet by mouth daily.   tiZANidine (ZANAFLEX) 4 MG tablet Take 1 tablet (4 mg total) by mouth every 6 (six) hours as needed for muscle spasms.   No facility-administered medications prior to visit.    Review of Systems  Constitutional:  Positive for appetite change and fatigue. Negative for chills and fever.  Respiratory:  Negative for chest tightness and shortness of breath.   Cardiovascular:  Negative for chest pain and palpitations.  Gastrointestinal:  Negative for abdominal pain, nausea and vomiting.  Neurological:  Positive for light-headedness and headaches. Negative for weakness.       Objective    BP 117/73 (BP Location: Left Arm, Cuff Size: Large)   Pulse 77   Temp 98.4 F (36.9 C) (Oral)   Resp 16   Wt 260 lb 11.2 oz (118.3 kg)   SpO2 99% Comment: room air  BMI 44.75 kg/m    Orthostatic VS for the past 24 hrs:  BP- Lying Pulse- Lying BP- Sitting Pulse- Sitting BP- Standing at 0 minutes Pulse- Standing at 0 minutes  04/05/23 1534 119/78 75 124/73 84 129/82 91      Physical Exam Vitals reviewed.  Constitutional:      General: She is not  in acute distress.    Appearance: Normal appearance. She is well-developed. She is not diaphoretic.  HENT:     Head: Normocephalic and atraumatic.  Eyes:     General: No scleral icterus.    Conjunctiva/sclera: Conjunctivae normal.  Neck:     Thyroid: No thyromegaly.  Cardiovascular:     Rate and Rhythm: Normal rate and regular rhythm.     Pulses: Normal pulses.     Heart sounds: Normal heart sounds. No  murmur heard. Pulmonary:     Effort: Pulmonary effort is normal. No respiratory distress.     Breath sounds: Normal breath sounds. No wheezing, rhonchi or rales.  Musculoskeletal:     Cervical back: Neck supple.     Right lower leg: No edema.     Left lower leg: No edema.  Lymphadenopathy:     Cervical: No cervical adenopathy.  Skin:    General: Skin is warm and dry.     Findings: No rash.  Neurological:     Mental Status: She is alert and oriented to person, place, and time. Mental status is at baseline.  Psychiatric:        Mood and Affect: Mood normal.        Behavior: Behavior normal.      No results found for any visits on 04/05/23.  Assessment & Plan     1. Primary hypertension Chronic and previously unstable. This morning it was more than 190 Today in the office, BP was 117/73, at goal. Orthostatic pressure was checked, Wnl Pt has been under a lot of stress, she has been working 12-16 hour shifts Advised to continue measuring her BP at home and bring BP device and BP log to her next appt with her PCP. Continue her current medications / current dose zestoretic 20-12.5 and almodipine 5 mg Unclear if her morning BP was accidental, cannot change her current dose without home BP monitoring. Advised low salt diet/Dash diet/ Mediterranean diet. FU in 2 weeks.  2. Morbid obesity Chronic Gained some weight since 09/2022 Her current BMI 44.75 Associated with uncontrolled BP, elevated cholesterol  No follow-ups on file. Fu with her PCP for BP     The patient was advised to call back or seek an in-person evaluation if the symptoms worsen or if the condition fails to improve as anticipated.  I discussed the assessment and treatment plan with the patient. The patient was provided an opportunity to ask questions and all were answered. The patient agreed with the plan and demonstrated an understanding of the instructions.  I, Debera Lat, PA-C have reviewed all  documentation for this visit. The documentation on  04/05/23  for the exam, diagnosis, procedures, and orders are all accurate and complete.  Debera Lat, [facility], MMS [facility] [phone] (phone) [phone] (fax)   [facility]

## 2023-04-05 NOTE — Telephone Encounter (Signed)
  Chief Complaint: Hypertension Symptoms: BP yesterday 191/101. Cannot check BP during call, pt at work. Reports "Feel bad." Lightheadedness, headache, fatigued.  BP meds were changed 03/29/23 "Been high since, it was high on other meds that's why changed but still high and I feel pretty bad." Frequency: Since 03/30/23 Pertinent Negatives: Patient denies  Disposition: [] ED /[] Urgent Care (no appt availability in office) / [x] Appointment(In office/virtual)/ []  [facility]/ [] Home Care/ [] Refused Recommended Disposition /[] [facility]/ []  Follow-up with PCP Additional Notes: Appt secured for today. Care advise provided, pt verbalizes understanding. Reason for Disposition  Systolic BP  >= 160 OR Diastolic >= 100  Answer Assessment - Initial Assessment Questions 1. BLOOD PRESSURE: "What is the blood pressure?" "Did you take at least two measurements 5 minutes apart?"     191/101 2. ONSET: "When did you take your blood pressure?"     Yesterday 3. HOW: "How did you take your blood pressure?" (e.g., automatic home BP monitor, visiting nurse)    Home 4. HISTORY: "Do you have a history of high blood pressure?"     Yes 5. MEDICINES: "Are you taking any medicines for blood pressure?" "Have you missed any doses recently?"     Yes, no missed doses, new meds 6. OTHER SYMPTOMS: "Do you have any symptoms?" (e.g., blurred vision, chest pain, difficulty breathing, headache, weakness)     Lightheadedness  Protocols used: Blood Pressure - High-A-AH

## 2023-04-12 ENCOUNTER — Ambulatory Visit: Payer: BC Managed Care – PPO | Admitting: Family Medicine

## 2023-05-01 ENCOUNTER — Ambulatory Visit: Payer: BC Managed Care – PPO | Admitting: Family Medicine

## 2023-05-01 ENCOUNTER — Encounter: Payer: Self-pay | Admitting: Family Medicine

## 2023-05-01 VITALS — BP 117/75 | HR 84 | Ht 64.0 in | Wt 257.7 lb

## 2023-05-01 DIAGNOSIS — I10 Essential (primary) hypertension: Secondary | ICD-10-CM | POA: Diagnosis not present

## 2023-05-01 DIAGNOSIS — M5431 Sciatica, right side: Secondary | ICD-10-CM

## 2023-05-01 NOTE — Progress Notes (Unsigned)
I,Sha'taria Tyson,acting as a Neurosurgeon for [employer], MD.,have documented all relevant documentation on the behalf of [name], MD,as directed by  [name], MD while in the presence of [name], MD.   Established patient visit   Patient: LYNITA Kline   DOB: 08-Dec-1970   52 y.o. Female  MRN: [id number] Visit Date: 05/01/2023  Today's healthcare provider: [name], MD   No chief complaint on file.  Subjective    HPI  Hypertension, follow-up  BP Readings from Last 3 Encounters:  05/01/23 117/75  04/05/23 117/73  03/29/23 (!) 156/88   Wt Readings from Last 3 Encounters:  05/01/23 257 lb 11.2 oz (116.9 kg)  04/05/23 260 lb 11.2 oz (118.3 kg)  03/29/23 266 lb 14.4 oz (121.1 kg)     Heidi Kline was last seen for hypertension 3 weeks ago.  BP at that visit was 117/73. Management since that visit includes continue current treatment.  Heidi Kline reports excellent compliance with treatment. Heidi Kline is not having side effects.  Heidi Kline is following a Regular diet. Heidi Kline is exercising. Heidi Kline does not smoke. Heidi Kline has been drinking more water, cutting back on calories,    Outside blood pressures are 113-120/70-80's Symptoms: No chest pain No chest pressure  No palpitations No syncope  No dyspnea No orthopnea  No paroxysmal nocturnal dyspnea No lower extremity edema   Pertinent labs No results found for: "CHOL", "HDL", "LDLCALC", "LDLDIRECT", "TRIG", "CHOLHDL" Lab Results  Component Value Date   NA 141 03/29/2023   K 4.0 03/29/2023   CREATININE 1.05 (H) 03/29/2023   EGFR 64 03/29/2023   GLUCOSE 84 03/29/2023   TSH 1.890 03/29/2023     The 10-year ASCVD risk score (Arnett DK, et al., 2019) is: 2.9%  ---------------------------------------------------------------------------------------------------  Sciatica, RLE Patient reports continued intermittent symptoms of sciatica that are temporarily relieved by  rest States that Heidi Kline often works 16-hour days and notices that her pain is worse on these days Heidi Kline is requesting a refill of tizanidine 4 mg tablets to use as needed Denies pain today   Medications: Outpatient Medications Prior to Visit  Medication Sig   acyclovir (ZOVIRAX) 400 MG tablet Take 1 tablet (400 mg total) by mouth 2 (two) times daily.   amLODipine (NORVASC) 5 MG tablet Take 1 tablet (5 mg total) by mouth daily.   lisinopril-hydrochlorothiazide (ZESTORETIC) 20-12.5 MG tablet Take 1 tablet by mouth daily.   tiZANidine (ZANAFLEX) 4 MG tablet Take 1 tablet (4 mg total) by mouth every 6 (six) hours as needed for muscle spasms.   No facility-administered medications prior to visit.    Review of Systems     Objective    BP 117/75 (BP Location: Left Arm, Patient Position: Sitting, Cuff Size: Normal)   Pulse 84   Ht 5\' 4"  (1.626 m)   Wt 257 lb 11.2 oz (116.9 kg)   BMI 44.23 kg/m    Physical Exam Vitals reviewed.  Constitutional:      General: Heidi Kline is not in acute distress.    Appearance: Normal appearance. Heidi Kline is not ill-appearing, toxic-appearing or diaphoretic.  Eyes:     Conjunctiva/sclera: Conjunctivae normal.  Cardiovascular:     Rate and Rhythm: Normal rate and regular rhythm.     Pulses: Normal pulses.     Heart sounds: Normal heart sounds. No murmur heard.    No friction rub. No gallop.  Pulmonary:     Effort: Pulmonary effort is normal. No respiratory distress.     Breath sounds:  Normal breath sounds. No stridor. No wheezing, rhonchi or rales.  Abdominal:     General: Bowel sounds are normal. There is no distension.     Palpations: Abdomen is soft.     Tenderness: There is no abdominal tenderness.  Musculoskeletal:     Right lower leg: No edema.     Left lower leg: No edema.  Skin:    Findings: No erythema or rash.  Neurological:     Mental Status: Heidi Kline is alert and oriented to person, place, and time.       No results found for any visits on  05/01/23.  Assessment & Plan     Problem List Items Addressed This Visit       Cardiovascular and Mediastinum   Hypertension - Primary    Chronic Controlled BP at goal Continue lisinopril-hydrochlorothiazide 20-12.5 mg once daily and amlodipine 5 mg once daily No medications changes today          Nervous and Auditory   Sciatica of right side    Chronic No current symptoms today, symptoms are relieved with decrease in work hours Counseled patient on importance of adequate rest and avoiding prolonged standing is much as possible Requested refill tizanidine 4 mg every 6 hours as needed Will continue to monitor clinically Refills provided today        Return in about 2 months (around 07/01/2023) for CPE.        The entirety of the information documented in the History of Present Illness, Review of Systems and Physical Exam were personally obtained by me. Portions of this information were initially documented by Sha'taria Tyson,CMA . I, [name], MD have reviewed the documentation above for thoroughness and accuracy.      [name], MD  [facility] [phone] (phone) [phone] (fax)  [facility]

## 2023-05-03 DIAGNOSIS — M5431 Sciatica, right side: Secondary | ICD-10-CM | POA: Insufficient documentation

## 2023-05-03 NOTE — Assessment & Plan Note (Signed)
Chronic Controlled BP at goal Continue lisinopril-hydrochlorothiazide 20-12.5 mg once daily and amlodipine 5 mg once daily No medications changes today

## 2023-05-03 NOTE — Assessment & Plan Note (Addendum)
Chronic No current symptoms today, symptoms are relieved with decrease in work hours Counseled patient on importance of adequate rest and avoiding prolonged standing is much as possible Requested refill tizanidine 4 mg every 6 hours as needed Will continue to monitor clinically Refills provided today

## 2023-06-27 NOTE — Progress Notes (Unsigned)
Complete physical exam   Patient: KHAMAYA FIRST   DOB: Dec 23, 1970   52 y.o. Female  MRN: [id number] Visit Date: 07/03/2023  Today's healthcare provider: [name], MD   Chief Complaint  Patient presents with   Annual Exam    Pinched nerve in the lower back    Subjective    Moneta Mcculley Kaupp is a 52 y.o. female who presents today for a complete physical exam.   She reports consuming a general diet.    Walks at work and in neighborhood, has Financial controller as well.   She generally feels in pain 2/2 to neuropathy.   She reports sleeping well.    She does have additional problems to discuss today.   Discussed the use of AI scribe software for clinical note transcription with the patient, who gave verbal consent to proceed.  History of Present Illness   The patient, with a history of neuropathy, presents with worsening symptoms despite various treatments. She reports that gabapentin, Lyrica, TCAs, and Cymbalta have all been ineffective in managing her condition. The patient describes the pain as severe, likening it to being stabbed with a knife, particularly at the bottom of her feet. The discomfort is exacerbated by prolonged standing and walking, common in her daily work routine. Attempts at home remedies, such as soaking in Epsom salt, have provided no relief.  The patient also reports a pinched nerve, localized to the right side of her lower back. The pain is significant enough to affect her mobility, particularly when walking or standing for extended periods. She has previously been prescribed Zanaflex, a muscle relaxant, which she reports helps somewhat, particularly at night.  In addition to neuropathy and a pinched nerve, the patient has been struggling with weight management. She reports a previous trial of Ozempic, which resulted in significant weight loss but was discontinued due to intolerable side effects. The patient expresses a desire to lose  weight but is hesitant to try other weight loss medications due to her previous experience.  The patient's overall mood and outlook appear positive, despite her chronic pain and weight struggles. She reports a good sleep pattern, aided occasionally by Trazodone. She also engages in regular walking for exercise and has recently acquired a stepper for home use. Despite her physical discomfort, the patient is determined to maintain an active lifestyle, including participating in bike fests and other outdoor activities.        Past Medical History:  Diagnosis Date   Hypertension    Neuropathy    Past Surgical History:  Procedure Laterality Date   ABDOMINAL HYSTERECTOMY     skin grafts     Social History   Socioeconomic History   Marital status: Single    Spouse name: Not on file   Number of children: Not on file   Years of education: Not on file   Highest education level: Associate degree: occupational, Scientist, product/process development, or vocational program  Occupational History   Not on file  Tobacco Use   Smoking status: Never   Smokeless tobacco: Never  Vaping Use   Vaping status: Never Used  Substance and Sexual Activity   Alcohol use: No    Alcohol/week: 0.0 standard drinks of alcohol   Drug use: Never   Sexual activity: Not on file  Other Topics Concern   Not on file  Social History Narrative   Not on file   Social Determinants of Health   Financial Resource Strain: Medium Risk (04/05/2023)  Overall Financial Resource Strain (CARDIA)    Difficulty of Paying Living Expenses: Somewhat hard  Food Insecurity: Food Insecurity Present (04/05/2023)   Hunger Vital Sign    Worried About Running Out of Food in the Last Year: Sometimes true    Ran Out of Food in the Last Year: Sometimes true  Transportation Needs: No Transportation Needs (04/05/2023)   PRAPARE - Administrator, Civil Service (Medical): No    Lack of Transportation (Non-Medical): No  Physical Activity: Insufficiently  Active (04/05/2023)   Exercise Vital Sign    Days of Exercise per Week: 1 day    Minutes of Exercise per Session: 30 min  Stress: No Stress Concern Present (04/05/2023)   [employer] of Occupational Health - Occupational Stress Questionnaire    Feeling of Stress : Only a little  Social Connections: Moderately Isolated (04/05/2023)   Social Connection and Isolation Panel [NHANES]    Frequency of Communication with Friends and Family: Once a week    Frequency of Social Gatherings with Friends and Family: Once a week    Attends Religious Services: More than 4 times per year    Active Member of [employer] or Organizations: Yes    Attends Banker Meetings: 1 to 4 times per year    Marital Status: Divorced  Catering manager Violence: Not on file   Family Status  Relation Name Status   Mother  Alive   Father  Alive   Mat Aunt  (Not Specified)   Oceanographer  (Not Specified)  No partnership data on file   Family History  Problem Relation Age of Onset   Diabetes Mother    Cancer Mother    Diabetes Father    Cancer Father    Breast cancer Maternal Aunt    Breast cancer Paternal Aunt    No Known Allergies   Medications: Outpatient Medications Prior to Visit  Medication Sig   acyclovir (ZOVIRAX) 400 MG tablet Take 1 tablet (400 mg total) by mouth 2 (two) times daily.   amLODipine (NORVASC) 5 MG tablet Take 1 tablet (5 mg total) by mouth daily.   lisinopril-hydrochlorothiazide (ZESTORETIC) 20-12.5 MG tablet Take 1 tablet by mouth daily.   [DISCONTINUED] tiZANidine (ZANAFLEX) 4 MG tablet Take 1 tablet (4 mg total) by mouth every 6 (six) hours as needed for muscle spasms.   No facility-administered medications prior to visit.    Review of Systems     Objective    BP 118/80 (BP Location: Right Arm, Patient Position: Sitting, Cuff Size: Large)   Pulse 71   Ht 5\' 4"  (1.626 m)   Wt 262 lb 4.8 oz (119 kg)   SpO2 98%   BMI 45.02 kg/m      Physical Exam Vitals  reviewed.  Constitutional:      General: She is not in acute distress.    Appearance: Normal appearance. She is not ill-appearing, toxic-appearing or diaphoretic.  HENT:     Head: Normocephalic and atraumatic.     Right Ear: Tympanic membrane and external ear normal. There is no impacted cerumen.     Left Ear: Tympanic membrane and external ear normal. There is no impacted cerumen.     Nose: Nose normal.     Mouth/Throat:     Pharynx: Oropharynx is clear.  Eyes:     General: No scleral icterus.    Extraocular Movements: Extraocular movements intact.     Conjunctiva/sclera: Conjunctivae normal.     Pupils: Pupils are  equal, round, and reactive to light.  Cardiovascular:     Rate and Rhythm: Normal rate and regular rhythm.     Pulses: Normal pulses.     Heart sounds: Normal heart sounds. No murmur heard.    No friction rub. No gallop.  Pulmonary:     Effort: Pulmonary effort is normal. No respiratory distress.     Breath sounds: Normal breath sounds. No wheezing, rhonchi or rales.  Abdominal:     General: Bowel sounds are normal. There is no distension.     Palpations: Abdomen is soft. There is no mass.     Tenderness: There is no abdominal tenderness. There is no guarding.  Musculoskeletal:        General: Tenderness present. No deformity.     Cervical back: Normal range of motion and neck supple. No rigidity.     Right lower leg: No edema.     Left lower leg: No edema.     Comments: Patient reports pain with flexion at the hips, patient is able to extend right lower extremity to 100degrees with reported pain, ROM is limited 2/2 to pain   Lymphadenopathy:     Cervical: No cervical adenopathy.  Skin:    General: Skin is warm.     Capillary Refill: Capillary refill takes less than 2 seconds.     Findings: No erythema or rash.  Neurological:     General: No focal deficit present.     Mental Status: She is alert and oriented to person, place, and time.     Motor: No weakness.      Gait: Gait abnormal.  Psychiatric:        Mood and Affect: Mood normal.        Behavior: Behavior normal.       Last depression screening scores    03/29/2023    3:16 PM  PHQ 2/9 Scores  PHQ - 2 Score 0    Last fall risk screening     No data to display          Last Audit-C alcohol use screening    04/05/2023   11:41 AM  Alcohol Use Disorder Test (AUDIT)  1. How often do you have a drink containing alcohol? 0  3. How often do you have six or more drinks on one occasion? 0   A score of 3 or more in women, and 4 or more in men indicates increased risk for alcohol abuse, EXCEPT if all of the points are from question 1   No results found for any visits on 07/03/23.  Assessment & Plan    Routine Health Maintenance and Physical Exam  Immunization History  Administered Date(s) Administered   Influenza, Seasonal, Injecte, Preservative Fre 07/16/2008, 12/01/2009   Influenza,inj,Quad PF,6+ Mos 12/07/2015, 08/13/2018, 09/06/2019   Influenza-Unspecified 09/06/2019, 10/26/2020   PFIZER(Purple Top)SARS-COV-2 Vaccination 05/06/2021   PPD Test 07/31/2022   Tdap 09/22/2019    Health Maintenance  Topic Date Due   HIV Screening  Never done   Hepatitis C Screening  Never done   Colonoscopy  Never done   Zoster Vaccines- Shingrix (1 of 2) Never done   COVID-19 Vaccine (2 - 2023-24 season) 07/07/2022   INFLUENZA VACCINE  06/07/2023   MAMMOGRAM  09/05/2024   DTaP/Tdap/Td (2 - Td or Tdap) 09/21/2029   HPV VACCINES  Aged Out   PAP SMEAR-Modifier  Discontinued    Problem List Items Addressed This Visit     Annual physical exam -  Primary    Chronic conditions are stable, see additional plan for obesity  Patient was counseled on benefits of regular physical activity with goal of 150 minutes of moderate to vigurous intensity 4 days per week  Patient was counseled to consume well balanced diet of fruits, vegetables, limited saturated fats and limited sugary foods and beverages  with emphasis on consuming 6-8 glasses of water daily  Screening recommended today: A1c, lipids,Hep C,HIV Vaccines recommended today: COVID,Shingrix, Influenza        Hypertension    Chronic  Bp at goal  Continue amlodipine and zestoretic 20-12.5mg  daily  CMP ordered today       Relevant Orders   CMP14+EGFR   Morbid (severe) obesity due to excess calories [facility])    Patient has tried Ozempic for weight loss but experienced significant side effects. Patient is interested in exploring bariatric surgery. -Chronic  -unable to lose weight with dietary changes and walking; exercise challenging due to severe neuropathy  -Refer to Bariatric Surgery for evaluation and potential surgical intervention.      Relevant Orders   Amb Referral to Bariatric Surgery   Neuropathy of foot   Relevant Medications   tiZANidine (ZANAFLEX) 4 MG tablet   oxyCODONE (OXY IR/ROXICODONE) 5 MG immediate release tablet   Sciatica of right side     Severe pain, particularly in the feet, unresponsive to gabapentin, Lyrica, and Cymbalta. Patient has been self-managing with Epsom salt soaks and lavender, but reports no relief. Zanaflex provides some relief and aids in sleep. -Increase Zanaflex to 4mg  every 6 hours as needed. -Prescribe Prednisone taper (40mg  for 2 days, 20mg  for 2 days, 10mg  for 3 days) to reduce inflammation. -Prescribe Oxycodone 5mg  as a backup for severe pain episodes.      Relevant Medications   tiZANidine (ZANAFLEX) 4 MG tablet   oxyCODONE (OXY IR/ROXICODONE) 5 MG immediate release tablet   predniSONE (DELTASONE) 20 MG tablet   Vitamin D deficiency    Chronic  Vitamin D levels ordered today  No current supplementation        Relevant Orders   Vitamin D (25 hydroxy)   Other Visit Diagnoses     Screening for diabetes mellitus       Relevant Orders   Hemoglobin A1c   Screening for HIV (human immunodeficiency virus)       Relevant Orders   HIV Antibody (routine testing w rflx)    Encounter for hepatitis C screening test for low risk patient       Relevant Orders   Hepatitis C antibody   Screening for lipid disorders       Relevant Orders   Lipid panel   Screening for deficiency anemia                   General Health Maintenance -Order labs including lipid panel, A1c, CMP, Hepatitis C screening, HIV screening, and Vitamin D level. -Continue Trazodone as needed for sleep.        Return in about 6 weeks (around 08/14/2023) for Weight MGMT.       [name], MD  [facility] [phone] (phone) [phone] (fax)  [facility]

## 2023-07-03 ENCOUNTER — Encounter: Payer: Self-pay | Admitting: Family Medicine

## 2023-07-03 ENCOUNTER — Ambulatory Visit (INDEPENDENT_AMBULATORY_CARE_PROVIDER_SITE_OTHER): Payer: BC Managed Care – PPO | Admitting: Family Medicine

## 2023-07-03 VITALS — BP 118/80 | HR 71 | Ht 64.0 in | Wt 262.3 lb

## 2023-07-03 DIAGNOSIS — M5431 Sciatica, right side: Secondary | ICD-10-CM

## 2023-07-03 DIAGNOSIS — Z1159 Encounter for screening for other viral diseases: Secondary | ICD-10-CM

## 2023-07-03 DIAGNOSIS — Z0001 Encounter for general adult medical examination with abnormal findings: Secondary | ICD-10-CM

## 2023-07-03 DIAGNOSIS — Z0000 Encounter for general adult medical examination without abnormal findings: Secondary | ICD-10-CM | POA: Insufficient documentation

## 2023-07-03 DIAGNOSIS — Z130 Encounter for screening for diseases of the blood and blood-forming organs and certain disorders involving the immune mechanism: Secondary | ICD-10-CM

## 2023-07-03 DIAGNOSIS — E559 Vitamin D deficiency, unspecified: Secondary | ICD-10-CM

## 2023-07-03 DIAGNOSIS — I10 Essential (primary) hypertension: Secondary | ICD-10-CM

## 2023-07-03 DIAGNOSIS — Z114 Encounter for screening for human immunodeficiency virus [HIV]: Secondary | ICD-10-CM

## 2023-07-03 DIAGNOSIS — G5792 Unspecified mononeuropathy of left lower limb: Secondary | ICD-10-CM

## 2023-07-03 DIAGNOSIS — Z13220 Encounter for screening for lipoid disorders: Secondary | ICD-10-CM

## 2023-07-03 DIAGNOSIS — Z131 Encounter for screening for diabetes mellitus: Secondary | ICD-10-CM

## 2023-07-03 MED ORDER — PREDNISONE 20 MG PO TABS
ORAL_TABLET | ORAL | 0 refills | Status: AC
Start: 2023-07-03 — End: 2023-07-10

## 2023-07-03 MED ORDER — TIZANIDINE HCL 4 MG PO TABS
4.0000 mg | ORAL_TABLET | Freq: Four times a day (QID) | ORAL | 2 refills | Status: DC | PRN
Start: 2023-07-03 — End: 2023-07-27

## 2023-07-03 MED ORDER — OXYCODONE HCL 5 MG PO TABS
5.0000 mg | ORAL_TABLET | ORAL | 0 refills | Status: DC | PRN
Start: 2023-07-03 — End: 2023-09-19

## 2023-07-03 NOTE — Patient Instructions (Signed)
We will follow up with results of labs once they are available.   A referral has been placed on your behalf for bariatric surgery. Our referral coordination team or the office you will be visiting will contact you within the next 2 weeks.  If you have not received a phone call within 10 business days please let us know so that we can check into this for you.

## 2023-07-04 NOTE — Assessment & Plan Note (Addendum)
Chronic  Vitamin D levels ordered today  No current supplementation

## 2023-07-04 NOTE — Assessment & Plan Note (Addendum)
Chronic conditions are stable, see additional plan for obesity  Patient was counseled on benefits of regular physical activity with goal of 150 minutes of moderate to vigurous intensity 4 days per week  Patient was counseled to consume well balanced diet of fruits, vegetables, limited saturated fats and limited sugary foods and beverages with emphasis on consuming 6-8 glasses of water daily  Screening recommended today: A1c, lipids,Hep C,HIV Vaccines recommended today: COVID,Shingrix, Influenza

## 2023-07-04 NOTE — Assessment & Plan Note (Signed)
  Severe pain, particularly in the feet, unresponsive to gabapentin, Lyrica, and Cymbalta. Patient has been self-managing with Epsom salt soaks and lavender, but reports no relief. Zanaflex provides some relief and aids in sleep. -Increase Zanaflex to 4mg  every 6 hours as needed. -Prescribe Prednisone taper (40mg  for 2 days, 20mg  for 2 days, 10mg  for 3 days) to reduce inflammation. -Prescribe Oxycodone 5mg  as a backup for severe pain episodes.

## 2023-07-04 NOTE — Assessment & Plan Note (Signed)
Chronic  Bp at goal  Continue amlodipine and zestoretic 20-12.5mg  daily  CMP ordered today

## 2023-07-04 NOTE — Assessment & Plan Note (Signed)
Patient has tried Ozempic for weight loss but experienced significant side effects. Patient is interested in exploring bariatric surgery. -Chronic  -unable to lose weight with dietary changes and walking; exercise challenging due to severe neuropathy  -Refer to Bariatric Surgery for evaluation and potential surgical intervention.

## 2023-07-17 ENCOUNTER — Encounter: Payer: Self-pay | Admitting: Family Medicine

## 2023-07-17 DIAGNOSIS — E785 Hyperlipidemia, unspecified: Secondary | ICD-10-CM | POA: Insufficient documentation

## 2023-07-17 LAB — LIPID PANEL
Chol/HDL Ratio: 3.5 ratio (ref 0.0–4.4)
Cholesterol, Total: 211 mg/dL — ABNORMAL HIGH (ref 100–199)
HDL: 60 mg/dL (ref >39)
LDL Chol Calc (NIH): 137 mg/dL — ABNORMAL HIGH (ref 0–99)
Triglycerides: 79 mg/dL (ref 0–149)
VLDL Cholesterol Cal: 14 mg/dL (ref 5–40)

## 2023-07-17 LAB — CMP14+EGFR
ALT: 27 IU/L (ref 0–32)
AST: 22 IU/L (ref 0–40)
Albumin: 4.4 g/dL (ref 3.8–4.9)
Alkaline Phosphatase: 105 IU/L (ref 44–121)
BUN/Creatinine Ratio: 12 (ref 9–23)
BUN: 13 mg/dL (ref 6–24)
Bilirubin Total: 0.4 mg/dL (ref 0.0–1.2)
CO2: 24 mmol/L (ref 20–29)
Calcium: 9.7 mg/dL (ref 8.7–10.2)
Chloride: 100 mmol/L (ref 96–106)
Creatinine, Ser: 1.06 mg/dL — ABNORMAL HIGH (ref 0.57–1.00)
Globulin, Total: 2.2 g/dL (ref 1.5–4.5)
Glucose: 121 mg/dL — ABNORMAL HIGH (ref 70–99)
Potassium: 4.2 mmol/L (ref 3.5–5.2)
Sodium: 142 mmol/L (ref 134–144)
Total Protein: 6.6 g/dL (ref 6.0–8.5)
eGFR: 63 mL/min/{1.73_m2} (ref >59)

## 2023-07-17 LAB — HEMOGLOBIN A1C
Est. average glucose Bld gHb Est-mCnc: 143 mg/dL
Hgb A1c MFr Bld: 6.6 % — ABNORMAL HIGH (ref 4.8–5.6)

## 2023-07-17 LAB — HIV ANTIBODY (ROUTINE TESTING W REFLEX): HIV Screen 4th Generation wRfx: NONREACTIVE

## 2023-07-17 LAB — VITAMIN D 25 HYDROXY (VIT D DEFICIENCY, FRACTURES): Vit D, 25-Hydroxy: 26.2 ng/mL — ABNORMAL LOW (ref 30.0–100.0)

## 2023-07-17 LAB — HEPATITIS C ANTIBODY: Hep C Virus Ab: NONREACTIVE

## 2023-07-25 ENCOUNTER — Other Ambulatory Visit: Payer: Self-pay | Admitting: Family Medicine

## 2023-07-25 DIAGNOSIS — G5792 Unspecified mononeuropathy of left lower limb: Secondary | ICD-10-CM

## 2023-07-25 DIAGNOSIS — M5431 Sciatica, right side: Secondary | ICD-10-CM

## 2023-07-26 NOTE — Telephone Encounter (Signed)
Requested medication (s) are due for refill today: Yes  Requested medication (s) are on the active medication list: Yes  Last refill:  07/03/23  Future visit scheduled: Yes  Notes to clinic:  Unable to refill per protocol due to diagnosis code needed. Pharmacy request 90-day supply     Requested Prescriptions  Pending Prescriptions Disp Refills   tiZANidine (ZANAFLEX) 4 MG tablet [Pharmacy Med Name: TIZANIDINE HCL 4 MG TABLET] 360 tablet 1    Sig: TAKE 1 TABLET BY MOUTH EVERY 6 HOURS AS NEEDED FOR MUSCLE SPASMS.     Not Delegated - Cardiovascular:  Alpha-2 Agonists - tizanidine Failed - 07/25/2023 11:26 AM      Failed - This refill cannot be delegated      Passed - Valid encounter within last 6 months    Recent Outpatient Visits           3 weeks ago Annual physical exam   [place] [facility] Simmons-Robinson, [place], MD   2 months ago Primary hypertension   [place] [facility] Fitzhugh, [place], MD   3 months ago Primary hypertension   [place] [facility] Bushnell, [place], PA-C   3 months ago Primary hypertension   [place] [facility] Simmons-Robinson, Tawanna Cooler, MD       Future Appointments             In 1 month Simmons-Robinson, Tawanna Cooler, MD [facility] [place]

## 2023-08-14 DIAGNOSIS — Z1330 Encounter for screening examination for mental health and behavioral disorders, unspecified: Secondary | ICD-10-CM | POA: Diagnosis not present

## 2023-08-14 DIAGNOSIS — I10 Essential (primary) hypertension: Secondary | ICD-10-CM | POA: Diagnosis not present

## 2023-08-31 NOTE — Progress Notes (Unsigned)
      Established patient visit   Patient: Heidi Kline   DOB: [birth year]   52 y.o. Female  MRN: [id number] Visit Date: 09/03/2023  Today's healthcare provider: [name], MD   No chief complaint on file.  Subjective       Discussed the use of AI scribe software for clinical note transcription with the patient, who gave verbal consent to proceed.  History of Present Illness             Past Medical History:  Diagnosis Date   Hypertension    Neuropathy     Medications: Outpatient Medications Prior to Visit  Medication Sig   acyclovir (ZOVIRAX) 400 MG tablet Take 1 tablet (400 mg total) by mouth 2 (two) times daily.   amLODipine (NORVASC) 5 MG tablet Take 1 tablet (5 mg total) by mouth daily.   lisinopril-hydrochlorothiazide (ZESTORETIC) 20-12.5 MG tablet Take 1 tablet by mouth daily.   oxyCODONE (OXY IR/ROXICODONE) 5 MG immediate release tablet Take 1 tablet (5 mg total) by mouth every 4 (four) hours as needed for severe pain.   tiZANidine (ZANAFLEX) 4 MG tablet TAKE 1 TABLET BY MOUTH EVERY 6 HOURS AS NEEDED FOR MUSCLE SPASMS.   No facility-administered medications prior to visit.    Review of Systems  {Insert previous labs (optional):23779} {See past labs  Heme  Chem  Endocrine  Serology  Results Review (optional):1}   Objective    There were no vitals taken for this visit. {Insert last BP/Wt (optional):23777}{See vitals history (optional):1}   Physical Exam  ***  No results found for any visits on 09/03/23.  Assessment & Plan     Problem List Items Addressed This Visit   None   Assessment and Plan              No follow-ups on file.         [name], MD  [facility] [phone] (phone) [phone] (fax)  [facility]

## 2023-09-03 ENCOUNTER — Encounter: Payer: Self-pay | Admitting: Family Medicine

## 2023-09-03 ENCOUNTER — Ambulatory Visit (INDEPENDENT_AMBULATORY_CARE_PROVIDER_SITE_OTHER): Payer: BC Managed Care – PPO | Admitting: Family Medicine

## 2023-09-03 VITALS — BP 114/80 | HR 77 | Wt 268.3 lb

## 2023-09-03 DIAGNOSIS — Z131 Encounter for screening for diabetes mellitus: Secondary | ICD-10-CM | POA: Diagnosis not present

## 2023-09-03 DIAGNOSIS — E1140 Type 2 diabetes mellitus with diabetic neuropathy, unspecified: Secondary | ICD-10-CM

## 2023-09-03 DIAGNOSIS — G5792 Unspecified mononeuropathy of left lower limb: Secondary | ICD-10-CM

## 2023-09-03 DIAGNOSIS — A609 Anogenital herpesviral infection, unspecified: Secondary | ICD-10-CM

## 2023-09-03 MED ORDER — FAMCICLOVIR 250 MG PO TABS
250.0000 mg | ORAL_TABLET | Freq: Two times a day (BID) | ORAL | 2 refills | Status: DC
Start: 2023-09-03 — End: 2024-08-15

## 2023-09-03 MED ORDER — TIRZEPATIDE 2.5 MG/0.5ML ~~LOC~~ SOAJ
2.5000 mg | SUBCUTANEOUS | 0 refills | Status: DC
Start: 2023-09-03 — End: 2023-12-13

## 2023-09-03 MED ORDER — TIRZEPATIDE 5 MG/0.5ML ~~LOC~~ SOAJ: 5.0000 mg | SUBCUTANEOUS | 1 refills | Status: DC

## 2023-09-08 ENCOUNTER — Emergency Department: Payer: BC Managed Care – PPO

## 2023-09-08 ENCOUNTER — Emergency Department
Admission: EM | Admit: 2023-09-08 | Discharge: 2023-09-08 | Disposition: A | Discharge status: Home / Self Care | Payer: BC Managed Care – PPO | Attending: Emergency Medicine | Admitting: Emergency Medicine

## 2023-09-08 DIAGNOSIS — Z5321 Procedure and treatment not carried out due to patient leaving prior to being seen by health care provider: Secondary | ICD-10-CM | POA: Diagnosis not present

## 2023-09-08 DIAGNOSIS — Y9364 Activity, baseball: Secondary | ICD-10-CM | POA: Diagnosis not present

## 2023-09-08 DIAGNOSIS — S99912A Unspecified injury of left ankle, initial encounter: Secondary | ICD-10-CM | POA: Diagnosis not present

## 2023-09-08 DIAGNOSIS — S93402A Sprain of unspecified ligament of left ankle, initial encounter: Secondary | ICD-10-CM | POA: Insufficient documentation

## 2023-09-08 DIAGNOSIS — X501XXA Overexertion from prolonged static or awkward postures, initial encounter: Secondary | ICD-10-CM | POA: Insufficient documentation

## 2023-09-08 DIAGNOSIS — S99922A Unspecified injury of left foot, initial encounter: Secondary | ICD-10-CM | POA: Diagnosis not present

## 2023-09-08 DIAGNOSIS — I10 Essential (primary) hypertension: Secondary | ICD-10-CM | POA: Insufficient documentation

## 2023-09-08 DIAGNOSIS — S93492A Sprain of other ligament of left ankle, initial encounter: Secondary | ICD-10-CM | POA: Diagnosis not present

## 2023-09-08 DIAGNOSIS — M25572 Pain in left ankle and joints of left foot: Secondary | ICD-10-CM | POA: Diagnosis not present

## 2023-09-08 MED ORDER — KETOROLAC TROMETHAMINE 30 MG/ML IJ SOLN
30.0000 mg | Freq: Once | INTRAMUSCULAR | Status: AC
  Administered 2023-09-08: 30 mg via INTRAMUSCULAR
  Filled 2023-09-08: qty 1

## 2023-09-08 NOTE — ED Triage Notes (Signed)
Pt at a baseball game last night and fell "in a ditch" injuring left ankle. Ankle appears swollen. Pt has bruise to right knee. Ambulated with difficulty to ER from car.

## 2023-09-08 NOTE — ED Provider Notes (Signed)
[facility] Provider Note    Event Date/Time   First MD Initiated Contact with Patient 09/08/23 1210     (approximate)   History   Chief Complaint Fall   HPI  Heidi Kline is a 52 y.o. female with past medical history of hypertension and GERD who presents to the ED complaining of ankle pain.  Patient reports that she was leaving a baseball game last night when she stepped in a small hole and inverted her left ankle.  She has had significant pain and swelling since then, has been able to bear weight on her left foot but describes severe pain when doing so.  She reports significant swelling over both sides of her ankle extending into the top of her foot.  She has been taking Tylenol with partial relief.  She denies any pain in her proximal leg or knee.     Physical Exam   Triage Vital Signs: ED Triage Vitals  Encounter Vitals Group     BP 09/08/23 1157 121/67     Systolic BP Percentile --      Diastolic BP Percentile --      Pulse Rate 09/08/23 1157 89     Resp 09/08/23 1157 18     Temp 09/08/23 1157 98.7 F (37.1 C)     Temp Source 09/08/23 1157 Oral     SpO2 09/08/23 1157 96 %     Weight --      Height --      Head Circumference --      Peak Flow --      Pain Score 09/08/23 1158 10     Pain Loc --      Pain Education --      Exclude from Growth Chart --     Most recent vital signs: Vitals:   09/08/23 1157  BP: 121/67  Pulse: 89  Resp: 18  Temp: 98.7 F (37.1 C)  SpO2: 96%    Constitutional: Alert and oriented. Eyes: Conjunctivae are normal. Head: Atraumatic. Nose: No congestion/rhinnorhea. Mouth/Throat: Mucous membranes are moist.  Cardiovascular: Normal rate, regular rhythm. Grossly normal heart sounds.  2+ radial and DP pulses bilaterally. Respiratory: Normal respiratory effort.  No retractions. Lungs CTAB. Gastrointestinal: Soft and nontender. No distention. Musculoskeletal: Tenderness to palpation noted to medial and  lateral malleolus on the left with associated edema but no obvious deformity.  No tenderness to palpation noted over first and fifth metatarsals. Neurologic:  Normal speech and language. No gross focal neurologic deficits are appreciated.    ED Results / Procedures / Treatments   Labs (all labs ordered are listed, but only abnormal results are displayed) Labs Reviewed - No data to display  RADIOLOGY Left ankle x-ray reviewed and interpreted by me with no fracture or dislocation.  PROCEDURES:  Critical Care performed: No  Procedures   MEDICATIONS ORDERED IN ED: Medications  ketorolac (TORADOL) 30 MG/ML injection 30 mg (30 mg Intramuscular Given 09/08/23 1300)     IMPRESSION / MDM / ASSESSMENT AND PLAN / ED COURSE  I reviewed the triage vital signs and the nursing notes.                              52 y.o. female with past medical history of hypertension and GERD who presents to the ED complaining of left ankle pain and swelling following fall with inversion injury last night.  Patient's presentation is most  consistent with acute complicated illness / injury requiring diagnostic workup.  Differential diagnosis includes, but is not limited to, fracture, dislocation, sprain.  Patient well-appearing and in no acute distress, vital signs are unremarkable.  She has edema and tenderness to medial and lateral malleoli of left ankle but no obvious deformity and is neurovascular intact distally.  X-ray imaging of foot and ankle is unremarkable, patient placed in cam boot and offered crutches but declines.  She was treated symptomatically with IM Toradol and is appropriate for discharge home with orthopedic follow-up.  She was counseled to return to the ED for new or worsening symptoms.  Patient agrees with plan.      FINAL CLINICAL IMPRESSION(S) / ED DIAGNOSES   Final diagnoses:  Sprain of left ankle, unspecified ligament, initial encounter     Rx / DC Orders   ED Discharge  Orders     None        Note:  This document was prepared using Dragon voice recognition software and may include unintentional dictation errors.   Chesley Noon, MD 09/08/23 [phone]

## 2023-09-18 NOTE — Progress Notes (Unsigned)
      Established patient visit   Patient: Heidi Kline   DOB: [birth year]   52 y.o. Female  MRN: [id number] Visit Date: 09/19/2023  Today's healthcare provider: [name], MD   No chief complaint on file.  Subjective       Discussed the use of AI scribe software for clinical note transcription with the patient, who gave verbal consent to proceed.  History of Present Illness             Past Medical History:  Diagnosis Date   Hypertension    Neuropathy     Medications: Outpatient Medications Prior to Visit  Medication Sig   amLODipine (NORVASC) 5 MG tablet Take 1 tablet (5 mg total) by mouth daily.   famciclovir (FAMVIR) 250 MG tablet Take 1 tablet (250 mg total) by mouth 2 (two) times daily.   lisinopril-hydrochlorothiazide (ZESTORETIC) 20-12.5 MG tablet Take 1 tablet by mouth daily.   oxyCODONE (OXY IR/ROXICODONE) 5 MG immediate release tablet Take 1 tablet (5 mg total) by mouth every 4 (four) hours as needed for severe pain.   tirzepatide (MOUNJARO) 2.5 MG/0.5ML Pen Inject 2.5 mg into the skin once a week.   tirzepatide [facility]) 5 MG/0.5ML Pen Inject 5 mg into the skin once a week.   tiZANidine (ZANAFLEX) 4 MG tablet TAKE 1 TABLET BY MOUTH EVERY 6 HOURS AS NEEDED FOR MUSCLE SPASMS.   No facility-administered medications prior to visit.    Review of Systems  {Insert previous labs (optional):23779} {See past labs  Heme  Chem  Endocrine  Serology  Results Review (optional):1}   Objective    There were no vitals taken for this visit. {Insert last BP/Wt (optional):23777}{See vitals history (optional):1}      Physical Exam  ***  No results found for any visits on 09/19/23.  Assessment & Plan     Problem List Items Addressed This Visit   None   Assessment and Plan              No follow-ups on file.         [name], MD  [facility] [phone] (phone) [phone]  (fax)  [facility]

## 2023-09-19 ENCOUNTER — Other Ambulatory Visit: Payer: Self-pay | Admitting: Family Medicine

## 2023-09-19 ENCOUNTER — Ambulatory Visit (INDEPENDENT_AMBULATORY_CARE_PROVIDER_SITE_OTHER): Payer: BC Managed Care – PPO | Admitting: Family Medicine

## 2023-09-19 ENCOUNTER — Encounter: Payer: Self-pay | Admitting: Family Medicine

## 2023-09-19 VITALS — BP 129/57 | HR 90 | Temp 98.2°F | Resp 16 | Ht 64.0 in | Wt 269.0 lb

## 2023-09-19 DIAGNOSIS — I10 Essential (primary) hypertension: Secondary | ICD-10-CM

## 2023-09-19 DIAGNOSIS — S93402A Sprain of unspecified ligament of left ankle, initial encounter: Secondary | ICD-10-CM | POA: Diagnosis not present

## 2023-09-19 MED ORDER — LISINOPRIL-HYDROCHLOROTHIAZIDE 20-12.5 MG PO TABS: 1.0000 | ORAL_TABLET | Freq: Every day | ORAL | 3 refills | Status: DC

## 2023-09-19 MED ORDER — AMLODIPINE BESYLATE 5 MG PO TABS: 5.0000 mg | ORAL_TABLET | Freq: Every day | ORAL | 3 refills | Status: DC

## 2023-09-19 NOTE — Assessment & Plan Note (Signed)
Left Ankle Sprain Follow-up for left ankle sprain sustained on September 07, 2023. X-rays from September 08, 2023, showed soft tissue swelling without fracture or dislocation. Reports persistent soreness and tenderness along the anterior left ankle, especially at night. Currently using a CAM boot but finds it heavy and uncomfortable. Requests a lighter surgical shoe for better comfort and mobility. Discussed benefits of a surgical shoe with a hard bottom to reduce pressure and improve mobility. Explained potential delay in obtaining the shoe from a medical supply store with a prescription. - acute  - Print prescription for a surgical shoe with a hard bottom. - Instruct to visit a medical supply store to obtain the surgical shoe. - Advise use of the surgical shoe for 4-6 weeks and monitor symptoms.

## 2023-09-19 NOTE — Assessment & Plan Note (Signed)
Chronic  Controlled  Continue amlodipine 5mg  .every day Continue lisinopril-hydrochlorothiazide 20-12.5mg  daily Refills provided

## 2023-09-19 NOTE — Patient Instructions (Signed)
VISIT SUMMARY:  Today, we discussed your recent left ankle sprain, which occurred when you stepped into a hole in the asphalt. X-rays showed no fractures, but you have been experiencing persistent pain and discomfort, especially at night. You have been using a CAM boot for support but find it too heavy.  YOUR PLAN:  -LEFT ANKLE SPRAIN: A left ankle sprain is an injury to the ligaments in your ankle, causing pain and swelling. We discussed switching from the heavy CAM boot to a lighter surgical shoe with a hard bottom to reduce pressure and improve your mobility. You will need to visit a medical supply store with the prescription provided to obtain the surgical shoe. Use the surgical shoe for 4-6 weeks and monitor your symptoms.  INSTRUCTIONS:  Please visit a medical supply store to obtain the prescribed surgical shoe with a hard bottom. Use the surgical shoe for 4-6 weeks and monitor your symptoms. If you experience any worsening pain or other concerns, please schedule a follow-up appointment.

## 2023-09-20 ENCOUNTER — Other Ambulatory Visit: Payer: Self-pay | Admitting: Family Medicine

## 2023-09-20 DIAGNOSIS — M5431 Sciatica, right side: Secondary | ICD-10-CM

## 2023-09-20 DIAGNOSIS — G5792 Unspecified mononeuropathy of left lower limb: Secondary | ICD-10-CM

## 2023-09-20 DIAGNOSIS — I10 Essential (primary) hypertension: Secondary | ICD-10-CM

## 2023-09-20 NOTE — Telephone Encounter (Signed)
Requested medication (s) are due for refill today: no}  Requested medication (s) are on the active medication list: yes  Last refill:  07/27/23 #360 1 RF  Future visit scheduled:no  Notes to clinic:  med not delegated to NT to RF   Requested Prescriptions  Pending Prescriptions Disp Refills   tiZANidine (ZANAFLEX) 4 MG tablet 360 tablet     Sig: Take 1 tablet (4 mg total) by mouth every 6 (six) hours as needed for muscle spasms.     Not Delegated - Cardiovascular:  Alpha-2 Agonists - tizanidine Failed - 09/20/2023  3:33 PM      Failed - This refill cannot be delegated      Passed - Valid encounter within last 6 months    Recent Outpatient Visits           Yesterday Sprain of left ankle, unspecified ligament, initial encounter   [place] [facility] Simmons-Robinson, [place], MD   2 weeks ago Neuropathy of left foot   [place] [facility] Simmons-Robinson, [place], [place]   2 months ago Annual physical exam   [place] [facility] [place], [place], MD   4 months ago Primary hypertension   [place] [facility] [place], [place], MD   5 months ago Primary hypertension   [place] [facility], [place], [place]              Refused Prescriptions Disp Refills   amLODipine (NORVASC) 5 MG tablet 90 tablet     Sig: Take 1 tablet (5 mg total) by mouth daily.     Cardiovascular: Calcium Channel Blockers 2 Passed - 09/20/2023  3:33 PM      Passed - Last BP in normal range    BP Readings from Last 1 Encounters:  09/19/23 (!) 129/57         Passed - Last Heart Rate in normal range    Pulse Readings from Last 1 Encounters:  09/19/23 90         Passed - Valid encounter within last 6 months    Recent Outpatient Visits           Yesterday Sprain of left ankle, unspecified ligament, initial encounter   [place] [facility] Simmons-Robinson, [place], MD    2 weeks ago Neuropathy of left foot   [place] [facility] [place]-Robinson, [place], MD   2 months ago Annual physical exam   [place] [facility] Simmons-Robinson, [place], MD   4 months ago Primary hypertension   [place] [place], [place], MD   5 months ago Primary hypertension   [place] [facility] [place], [place], [place]

## 2023-09-20 NOTE — Telephone Encounter (Signed)
Medication Refill -  Most Recent Primary Care Visit:  Provider: [name]  Department: [facility]  Visit Type: HOSPITAL FU  Date: 09/19/2023  Medication:  amLODipine (NORVASC) 5 MG tablet  tiZANidine (ZANAFLEX) 4 MG tablet  *Patient is getting low on medication   Has the patient contacted their pharmacy? Yes, patient states the pharmacy reached out to her stating PCP did not refill the two medications above. Patient states it was automated and I advised her to speak to a live person.  Is this the correct pharmacy for this prescription? Yes, the one listed below  This is the patient's preferred pharmacy:  [id number] [place], [place] - [address] [place] [address] Phone: [phone] Fax: [phone]   Has the prescription been filled recently?  Last fille for tiZANidine was September 2024 Last fill for amLODipine not sure, chart is showing it was received by Pharmacy   Is the patient out of the medication?  Has the patient been seen for an appointment in the last year OR does the patient have an upcoming appointment? Last seen by PCP on 11.13.2024

## 2023-09-20 NOTE — Telephone Encounter (Signed)
Requested Prescriptions  Pending Prescriptions Disp Refills   amLODipine (NORVASC) 5 MG tablet 90 tablet     Sig: Take 1 tablet (5 mg total) by mouth daily.     Cardiovascular: Calcium Channel Blockers 2 Passed - 09/20/2023  9:44 AM      Passed - Last BP in normal range    BP Readings from Last 1 Encounters:  09/19/23 (!) 129/57         Passed - Last Heart Rate in normal range    Pulse Readings from Last 1 Encounters:  09/19/23 90         Passed - Valid encounter within last 6 months    Recent Outpatient Visits           Yesterday Sprain of left ankle, unspecified ligament, initial encounter   [place] [facility] Simmons-Robinson, [place], MD   2 weeks ago Neuropathy of left foot   [place] [facility] Simmons-Robinson, [place], [place]   2 months ago Annual physical exam   [place] [facility] [place], [place], MD   4 months ago Primary hypertension   [place] [facility] [place], [place], MD   5 months ago Primary hypertension   [place] [facility] [place], [place] [employer], PA-C               tiZANidine (ZANAFLEX) 4 MG tablet 360 tablet     Sig: Take 1 tablet (4 mg total) by mouth every 6 (six) hours as needed for muscle spasms.     Not Delegated - Cardiovascular:  Alpha-2 Agonists - tizanidine Failed - 09/20/2023  9:44 AM      Failed - This refill cannot be delegated      Passed - Valid encounter within last 6 months    Recent Outpatient Visits           Yesterday Sprain of left ankle, unspecified ligament, initial encounter   [place] [facility] Simmons-Robinson, [place], MD   2 weeks ago Neuropathy of left foot   [place] [facility] Simmons-Robinson, [place], MD   2 months ago Annual physical exam   [place] [facility] Simmons-Robinson, [place], MD   4 months ago Primary hypertension   [place]  [facility], [place], MD   5 months ago Primary hypertension   [place] [facility] [place], [place], [place]

## 2023-09-20 NOTE — Telephone Encounter (Signed)
Requested Prescriptions  Pending Prescriptions Disp Refills   lisinopril-hydrochlorothiazide (ZESTORETIC) 20-12.5 MG tablet [Pharmacy Med Name: LISINOPRIL-HCTZ 20-12.5 MG TAB] 90 tablet 1    Sig: TAKE 1 TABLET BY MOUTH EVERY DAY     Cardiovascular:  ACEI + Diuretic Combos Failed - 09/19/2023  1:34 AM      Failed - Cr in normal range and within 180 days    Creatinine, Ser  Date Value Ref Range Status  07/16/2023 1.06 (H) 0.57 - 1.00 mg/dL Final         Passed - Na in normal range and within 180 days    Sodium  Date Value Ref Range Status  07/16/2023 142 134 - 144 mmol/L Final         Passed - K in normal range and within 180 days    Potassium  Date Value Ref Range Status  07/16/2023 4.2 3.5 - 5.2 mmol/L Final         Passed - eGFR is 30 or above and within 180 days    eGFR  Date Value Ref Range Status  07/16/2023 63 >59 mL/min/1.73 Final         Passed - Patient is not pregnant      Passed - Last BP in normal range    BP Readings from Last 1 Encounters:  09/19/23 (!) 129/57         Passed - Valid encounter within last 6 months    Recent Outpatient Visits           Yesterday Sprain of left ankle, unspecified ligament, initial encounter   [place] [facility]Robinson, [place], MD   2 weeks ago Neuropathy of left foot   [place] [facility] Simmons-Robinson, [place], MD   2 months ago Annual physical exam   [place] [facility] Simmons-Robinson, [place], MD   4 months ago Primary hypertension   [place] [facility] [place], [place], MD   5 months ago Primary hypertension   [place] [facility] [place], [place], PA-C               amLODipine (NORVASC) 5 MG tablet [Pharmacy Med Name: AMLODIPINE BESYLATE 5 MG TAB] 90 tablet 1    Sig: TAKE 1 TABLET (5 MG TOTAL) BY MOUTH DAILY.     Cardiovascular: Calcium Channel Blockers 2 Passed - 09/19/2023  1:34 AM       Passed - Last BP in normal range    BP Readings from Last 1 Encounters:  09/19/23 (!) 129/57         Passed - Last Heart Rate in normal range    Pulse Readings from Last 1 Encounters:  09/19/23 90         Passed - Valid encounter within last 6 months    Recent Outpatient Visits           Yesterday Sprain of left ankle, unspecified ligament, initial encounter   [place] [facility] Simmons-Robinson, [place], MD   2 weeks ago Neuropathy of left foot   [place] [facility] Simmons-Robinson, [place], MD   2 months ago Annual physical exam   [place] Simmons-Robinson, [place], MD   4 months ago Primary hypertension   [place] [facility] [place], [place], MD   5 months ago Primary hypertension   [place] [facility] [place], [place], [place]

## 2023-09-21 MED ORDER — TIZANIDINE HCL 4 MG PO TABS: 4.0000 mg | ORAL_TABLET | Freq: Four times a day (QID) | ORAL | 1 refills | Status: DC | PRN

## 2023-10-08 ENCOUNTER — Other Ambulatory Visit (HOSPITAL_COMMUNITY)
Admission: RE | Admit: 2023-10-08 | Discharge: 2023-10-08 | Disposition: A | Discharge status: Home / Self Care | Payer: BC Managed Care – PPO | Source: Ambulatory Visit | Attending: Family Medicine | Admitting: Family Medicine

## 2023-10-08 ENCOUNTER — Ambulatory Visit (INDEPENDENT_AMBULATORY_CARE_PROVIDER_SITE_OTHER): Payer: BC Managed Care – PPO | Admitting: Family Medicine

## 2023-10-08 ENCOUNTER — Telehealth: Payer: Self-pay

## 2023-10-08 ENCOUNTER — Encounter: Payer: Self-pay | Admitting: Family Medicine

## 2023-10-08 VITALS — BP 119/66 | HR 97 | Resp 16 | Ht 64.0 in | Wt 266.0 lb

## 2023-10-08 DIAGNOSIS — E1140 Type 2 diabetes mellitus with diabetic neuropathy, unspecified: Secondary | ICD-10-CM | POA: Diagnosis not present

## 2023-10-08 DIAGNOSIS — Z7251 High risk heterosexual behavior: Secondary | ICD-10-CM | POA: Diagnosis not present

## 2023-10-08 DIAGNOSIS — S93402D Sprain of unspecified ligament of left ankle, subsequent encounter: Secondary | ICD-10-CM

## 2023-10-08 NOTE — Progress Notes (Signed)
Established patient visit   Patient: Heidi Kline   DOB: 21-Sep-1971   52 y.o. Female  MRN: [id number] Visit Date: 10/08/2023  Today's healthcare provider: [name], MD   Chief Complaint  Patient presents with   Exposure to STD    possible   Subjective     HPI     Exposure to STD    Additional comments: possible      Last edited by Ashok Cordia, CMA on 10/08/2023  3:12 PM.       Discussed the use of AI scribe software for clinical note transcription with the patient, who gave verbal consent to proceed.  History of Present Illness   The patient, with a history of hysterectomy, presented with concerns following a recent sexual encounter after a six-year period of abstinence. She expressed significant anxiety and worry, leading to sleep disturbances. She denied any symptoms of vaginal discharge or abnormal bleeding. On examination, no signs of irritation were observed on the vaginal cuff, and no ulcers were noted on the external genitalia. A scant amount of thin, clear discharge was noted in the posterior vaginal vault. The patient declined HIV and syphilis testing.  In addition, the patient reported ongoing discomfort from a left ankle sprain. She described the area as 'squishy' and suspected it might be infected. However, on examination, there was no erythema or warmth noted, suggesting no active infection. The patient has been managing the injury with a surgical shoe and declined a referral to orthopedics at this time.      Patient with hx of elevated A1c, will obtain urine albumin sample today  Lab Results  Component Value Date   HGBA1C 6.6 (H) 07/16/2023      Past Medical History:  Diagnosis Date   Hypertension    Neuropathy     Medications: Outpatient Medications Prior to Visit  Medication Sig   amLODipine (NORVASC) 5 MG tablet Take 1 tablet (5 mg total) by mouth daily.   famciclovir (FAMVIR) 250 MG tablet Take 1 tablet (250 mg  total) by mouth 2 (two) times daily.   lisinopril-hydrochlorothiazide (ZESTORETIC) 20-12.5 MG tablet Take 1 tablet by mouth daily.   tirzepatide [facility]) 2.5 MG/0.5ML Pen Inject 2.5 mg into the skin once a week.   tirzepatide [facility]) 5 MG/0.5ML Pen Inject 5 mg into the skin once a week.   tiZANidine (ZANAFLEX) 4 MG tablet Take 1 tablet (4 mg total) by mouth every 6 (six) hours as needed for muscle spasms.   No facility-administered medications prior to visit.    Review of Systems  Last metabolic panel Lab Results  Component Value Date   GLUCOSE 121 (H) 07/16/2023   NA 142 07/16/2023   K 4.2 07/16/2023   CL 100 07/16/2023   CO2 24 07/16/2023   BUN 13 07/16/2023   CREATININE 1.06 (H) 07/16/2023   EGFR 63 07/16/2023   CALCIUM 9.7 07/16/2023   PROT 6.6 07/16/2023   ALBUMIN 4.4 07/16/2023   LABGLOB 2.2 07/16/2023   AGRATIO 2.0 03/29/2023   BILITOT 0.4 07/16/2023   ALKPHOS 105 07/16/2023   AST 22 07/16/2023   ALT 27 07/16/2023   Last hemoglobin A1c Lab Results  Component Value Date   HGBA1C 6.6 (H) 07/16/2023        Objective    BP 119/66   Pulse 97   Resp 16   Ht 5\' 4"  (1.626 m)   Wt 266 lb (120.7 kg)   SpO2 100%  BMI 45.66 kg/m  BP Readings from Last 3 Encounters:  10/08/23 119/66  09/19/23 (!) 129/57  09/08/23 121/67   Wt Readings from Last 3 Encounters:  10/08/23 266 lb (120.7 kg)  09/19/23 269 lb (122 kg)  09/03/23 268 lb 4.8 oz (121.7 kg)        Physical Exam  Physical Exam   GENITOURINARY: Vaginal tissues healthy, pink, no lesions, clear discharge in posterior vaginal vault. EXTREMITIES: Left ankle swollen, non-red, non-hot, with edema likely related to injury.       No results found for any visits on 10/08/23.  Assessment & Plan     Problem List Items Addressed This Visit       Musculoskeletal and Integument   Sprain of left ankle    Persistent swelling and pain in the left ankle despite wearing a surgical shoe for three weeks.  Physical exam reveals edema without erythema or warmth, suggesting no active infection. Significant discomfort, especially at night. Declines orthopedic referral at this time. Discussed potential need for updated x-rays and monitoring for signs of infection. - Continue current management with surgical shoe - Consider referral to orthopedics if symptoms persist or worsen - Advise continued use of ice and elevation at night      Other Visit Diagnoses     Unprotected sexual intercourse    -  Primary   Relevant Orders   Cervicovaginal ancillary only   Urine Microalbumin w/creat. ratio   Type 2 diabetes mellitus with diabetic neuropathy, without long-term current use of insulin (HCC)       Relevant Orders   Urine Microalbumin w/creat. ratio      T2DM  Chronic  Well controlled with A1c less than 7  Urine albumin testing collected today  Continue lisinopril       Sexually Transmitted Infection (STI) Screening Anxious about potential STI exposure after a sexual encounter following six years of abstinence. No symptoms of discharge or abnormal bleeding. Physical exam shows healthy vaginal tissue with scant thin, clear discharge. No signs of irritation, ulcers, or infection. Discussed screening for gonorrhea, chlamydia, trichomoniasis, bacterial vaginitis, and vaginal candidiasis. Declines HIV and syphilis testing due to recent exposure not being sufficient for accurate results. - Collect Optima swab for gonorrhea, chlamydia, trichomoniasis, bacterial vaginitis, and vaginal candidiasis - Communicate results once available - Declines HIV and syphilis testing     General Health Maintenance Not due for a Pap smear  due to hysterectomy.       No follow-ups on file.         [name], MD  [facility] [phone] (phone) [phone] (fax)  [facility]

## 2023-10-08 NOTE — Assessment & Plan Note (Signed)
Persistent swelling and pain in the left ankle despite wearing a surgical shoe for three weeks. Physical exam reveals edema without erythema or warmth, suggesting no active infection. Significant discomfort, especially at night. Declines orthopedic referral at this time. Discussed potential need for updated x-rays and monitoring for signs of infection. - Continue current management with surgical shoe - Consider referral to orthopedics if symptoms persist or worsen - Advise continued use of ice and elevation at night

## 2023-10-08 NOTE — Telephone Encounter (Signed)
Returned call. Pt reports some swelling of lower extremity and would like to be seen as soon possible for testing/screening reporting new partner.   [name], MD

## 2023-10-08 NOTE — Telephone Encounter (Signed)
Copied from CRM [id number]. Topic: General - Other >> Oct 08, 2023  8:05 AM Everette C wrote: Reason for CRM: The patient would like to speak directly with their PCP continuing ongoing discussions about their left foot   Please contact the patient if/when possible

## 2023-10-09 LAB — MICROALBUMIN / CREATININE URINE RATIO
Creatinine, Urine: 183 mg/dL
Microalb/Creat Ratio: 4 mg/g{creat} (ref 0–29)
Microalbumin, Urine: 7.4 ug/mL

## 2023-10-10 LAB — CERVICOVAGINAL ANCILLARY ONLY
Bacterial Vaginitis (gardnerella): NEGATIVE
Candida Glabrata: NEGATIVE
Candida Vaginitis: NEGATIVE
Chlamydia: NEGATIVE
Comment: NEGATIVE
Comment: NEGATIVE
Comment: NEGATIVE
Comment: NEGATIVE
Comment: NEGATIVE
Comment: NORMAL
Neisseria Gonorrhea: NEGATIVE
Trichomonas: NEGATIVE

## 2023-11-19 ENCOUNTER — Ambulatory Visit
Admission: EM | Admit: 2023-11-19 | Discharge: 2023-11-19 | Disposition: A | Discharge status: Home / Self Care | Payer: Self-pay | Attending: Physician Assistant | Admitting: Physician Assistant

## 2023-11-19 DIAGNOSIS — R300 Dysuria: Secondary | ICD-10-CM

## 2023-11-19 DIAGNOSIS — N898 Other specified noninflammatory disorders of vagina: Secondary | ICD-10-CM | POA: Diagnosis not present

## 2023-11-19 DIAGNOSIS — N760 Acute vaginitis: Secondary | ICD-10-CM | POA: Diagnosis not present

## 2023-11-19 DIAGNOSIS — B9689 Other specified bacterial agents as the cause of diseases classified elsewhere: Secondary | ICD-10-CM | POA: Diagnosis not present

## 2023-11-19 DIAGNOSIS — A599 Trichomoniasis, unspecified: Secondary | ICD-10-CM

## 2023-11-19 LAB — URINALYSIS, W/ REFLEX TO CULTURE (INFECTION SUSPECTED)
Bilirubin Urine: NEGATIVE
Glucose, UA: NEGATIVE mg/dL
Hgb urine dipstick: NEGATIVE
Ketones, ur: NEGATIVE mg/dL
Nitrite: NEGATIVE
Protein, ur: NEGATIVE mg/dL
Specific Gravity, Urine: 1.025 (ref 1.005–1.030)
pH: 7 (ref 5.0–8.0)

## 2023-11-19 LAB — WET PREP, GENITAL
Sperm: NONE SEEN
WBC, Wet Prep HPF POC: >10 — AB (ref <10)
Yeast Wet Prep HPF POC: NONE SEEN

## 2023-11-19 MED ORDER — METRONIDAZOLE 500 MG PO TABS: 500.0000 mg | ORAL_TABLET | Freq: Two times a day (BID) | ORAL | 0 refills | Status: AC

## 2023-11-19 NOTE — Discharge Instructions (Addendum)
-  Trichomonas and bacterial vaginosis. - Pending gonorrhea chlamydia testing.  Those results will be back tomorrow we will call you with any positives.  If you see results before we do you may call us  to discuss in further and receive appropriate treatment. - Possible UTI but will wait for the urine culture result before starting you on more antibiotics. -No sexual intercourse until 1 week after you have completed treatment.

## 2023-11-19 NOTE — ED Provider Notes (Signed)
 [facility]    CSN: [id number] Arrival date & time: 11/19/23  1845      History   Chief Complaint Chief Complaint  Patient presents with   Dysuria   Vaginal Discharge    HPI Heidi Kline is a 53 y.o. female presenting for approximately 3-day history of dysuria, vaginal discharge and itching.  Denies frequency, urgency, hematuria, vaginal odor, vaginal lesions, abdominal pain, flank pain.  Patient has recently been active with a new sexual partner and would like to be screened for STIs.  HPI  Past Medical History:  Diagnosis Date   Hypertension    Neuropathy     Patient Active Problem List   Diagnosis Date Noted   Sprain of left ankle 09/19/2023   HLD (hyperlipidemia) 07/17/2023   Annual physical exam 07/03/2023   Sciatica of right side 05/03/2023   HSV (herpes simplex virus) anogenital infection 03/29/2023   Morbid (severe) obesity due to excess calories (HCC) 01/18/2023   GERD (gastroesophageal reflux disease) 06/16/2019   Urge incontinence 06/16/2019   Depression 08/13/2018   Insomnia 08/13/2018   Vitamin D  deficiency 08/13/2018   Plantar fasciitis 04/04/2015   Hypertension 12/04/2013   Neuropathy of foot 12/04/2013   Neuropathy of hand 12/04/2013    Past Surgical History:  Procedure Laterality Date   ABDOMINAL HYSTERECTOMY     skin grafts      OB History   No obstetric history on file.      Home Medications    Prior to Admission medications   Medication Sig Start Date End Date Taking? Authorizing Provider  amLODipine  (NORVASC ) 5 MG tablet Take 1 tablet (5 mg total) by mouth daily. 09/19/23  Yes Simmons-Robinson, Makiera, MD  famciclovir  (FAMVIR ) 250 MG tablet Take 1 tablet (250 mg total) by mouth 2 (two) times daily. 09/03/23  Yes Simmons-Robinson, Makiera, MD  lisinopril -hydrochlorothiazide  (ZESTORETIC ) 20-12.5 MG tablet Take 1 tablet by mouth daily. 09/19/23  Yes Simmons-Robinson, Makiera, MD  metroNIDAZOLE  (FLAGYL ) 500 MG  tablet Take 1 tablet (500 mg total) by mouth 2 (two) times daily for 7 days. 11/19/23 11/26/23 Yes Arvis Jolan KATHEE, PA-C  tiZANidine  (ZANAFLEX ) 4 MG tablet Take 1 tablet (4 mg total) by mouth every 6 (six) hours as needed for muscle spasms. 09/21/23  Yes Simmons-Robinson, Makiera, MD  tirzepatide  (MOUNJARO ) 2.5 MG/0.5ML Pen Inject 2.5 mg into the skin once a week. 09/03/23   Simmons-Robinson, Makiera, MD  tirzepatide  (MOUNJARO ) 5 MG/0.5ML Pen Inject 5 mg into the skin once a week. 09/03/23   Simmons-Robinson, Rockie, MD  carbamazepine (TEGRETOL) 200 MG tablet  02/16/15 12/16/19  [provider]  fluticasone  (FLONASE ) 50 MCG/ACT nasal spray Place 2 sprays into both nostrils daily. 12/30/15 12/16/19  Stephania Ozell RAMAN, MD    Family History Family History  Problem Relation Age of Onset   Diabetes Mother    Cancer Mother    Diabetes Father    Cancer Father    Breast cancer Maternal Aunt    Breast cancer Paternal Aunt     Social History Social History   Tobacco Use   Smoking status: Never   Smokeless tobacco: Never  Vaping Use   Vaping status: Never Used  Substance Use Topics   Alcohol use: No    Alcohol/week: 0.0 standard drinks of alcohol   Drug use: Never     Allergies   Patient has no known allergies.   Review of Systems Review of Systems  Constitutional:  Positive for fatigue. Negative for chills and  fever.  Gastrointestinal:  Negative for abdominal pain, diarrhea, nausea and vomiting.  Genitourinary:  Positive for dysuria and vaginal discharge. Negative for decreased urine volume, flank pain, frequency, hematuria, pelvic pain, urgency, vaginal bleeding and vaginal pain.  Musculoskeletal:  Negative for back pain.  Skin:  Negative for rash.     Physical Exam Triage Vital Signs ED Triage Vitals  Encounter Vitals Group     BP 11/19/23 1908 (!) 147/90     Systolic BP Percentile --      Diastolic BP Percentile --      Pulse Rate 11/19/23 1908 (!) 106     Resp  11/19/23 1908 16     Temp 11/19/23 1908 98.5 F (36.9 C)     Temp Source 11/19/23 1908 Oral     SpO2 11/19/23 1908 96 %     Weight 11/19/23 1908 265 lb (120.2 kg)     Height 11/19/23 1908 5' 4 (1.626 m)     Head Circumference --      Peak Flow --      Pain Score 11/19/23 1912 0     Pain Loc --      Pain Education --      Exclude from Growth Chart --    No data found.  Updated Vital Signs BP (!) 147/90 (BP Location: Left Arm)   Pulse (!) 106   Temp 98.5 F (36.9 C) (Oral)   Resp 16   Ht 5' 4 (1.626 m)   Wt 265 lb (120.2 kg)   SpO2 96%   BMI 45.49 kg/m    Physical Exam Vitals and nursing note reviewed.  Constitutional:      General: She is not in acute distress.    Appearance: Normal appearance. She is not ill-appearing or toxic-appearing.  HENT:     Head: Normocephalic and atraumatic.  Eyes:     General: No scleral icterus.       Right eye: No discharge.        Left eye: No discharge.     Conjunctiva/sclera: Conjunctivae normal.  Cardiovascular:     Rate and Rhythm: Normal rate and regular rhythm.     Heart sounds: Normal heart sounds.  Pulmonary:     Effort: Pulmonary effort is normal. No respiratory distress.     Breath sounds: Normal breath sounds.  Abdominal:     Palpations: Abdomen is soft.     Tenderness: There is no abdominal tenderness. There is no right CVA tenderness or left CVA tenderness.  Musculoskeletal:     Cervical back: Neck supple.  Skin:    General: Skin is dry.  Neurological:     General: No focal deficit present.     Mental Status: She is alert. Mental status is at baseline.     Motor: No weakness.     Gait: Gait normal.  Psychiatric:        Mood and Affect: Mood normal.        Behavior: Behavior normal.      UC Treatments / Results  Labs (all labs ordered are listed, but only abnormal results are displayed) Labs Reviewed  WET PREP, GENITAL - Abnormal; Notable for the following components:      Result Value   Trich, Wet  Prep PRESENT (*)    Clue Cells Wet Prep HPF POC PRESENT (*)    WBC, Wet Prep HPF POC >10 (*)    All other components within normal limits  URINALYSIS, W/ REFLEX TO CULTURE (INFECTION  SUSPECTED) - Abnormal; Notable for the following components:   Leukocytes,Ua MODERATE (*)    Bacteria, UA FEW (*)    Trichomonas, UA PRESENT (*)    All other components within normal limits  URINE CULTURE  CERVICOVAGINAL ANCILLARY ONLY    EKG   Radiology No results found.  Procedures Procedures (including critical care time)  Medications Ordered in UC Medications - No data to display  Initial Impression / Assessment and Plan / UC Course  I have reviewed the triage vital signs and the nursing notes.  Pertinent labs & imaging results that were available during my care of the patient were reviewed by me and considered in my medical decision making (see chart for details).   53 year old female presents for dysuria and vaginal discharge x 3 weeks.  Also request STI screening.  Patient is overall well-appearing.  No acute distress.  Abdomen soft and nontender.  No CVA tenderness.  Patient elects to forego pelvic exam and obtained vaginal self swab for wet prep and GC/chlamydia.  Urinalysis also obtained.  UA with moderate leukocytes and bacteria.  Will send for culture but patient does not believe she has UTI.  Her wet prep shows trichomonas and clue cells.  She would like to be treated for a UTI if her culture comes back positive.  Desires treatment for trichomonas at this time.  Sent metronidazole  to pharmacy.  Pending GC/chlamydia testing.  Will treat if positive results.  Reviewed safe sex practices and advised no sexual intercourse until 1 week after she and partner have completed therapy.   Final Clinical Impressions(s) / UC Diagnoses   Final diagnoses:  Trichomonas infection  BV (bacterial vaginosis)  Dysuria  Vaginal discharge     Discharge Instructions      -Trichomonas and bacterial  vaginosis. - Pending gonorrhea chlamydia testing.  Those results will be back tomorrow we will call you with any positives.  If you see results before we do you may call us  to discuss in further and receive appropriate treatment. - Possible UTI but will wait for the urine culture result before starting you on more antibiotics. -No sexual intercourse until 1 week after you have completed treatment.     ED Prescriptions     Medication Sig Dispense Auth. Provider   metroNIDAZOLE  (FLAGYL ) 500 MG tablet Take 1 tablet (500 mg total) by mouth 2 (two) times daily for 7 days. 14 tablet Jandel Patriarca B, PA-C      PDMP not reviewed this encounter.   Arvis Jolan NOVAK, PA-C 11/20/23 [phone]

## 2023-11-19 NOTE — ED Triage Notes (Signed)
 Pt c/o urinary discomfort & vaginal discharge x3 days. States has been with new sexual partner. Concerned about STD's.

## 2023-11-21 LAB — URINE CULTURE

## 2023-11-21 LAB — CERVICOVAGINAL ANCILLARY ONLY
Chlamydia: NEGATIVE
Comment: NEGATIVE
Comment: NEGATIVE
Comment: NORMAL
Neisseria Gonorrhea: NEGATIVE
Trichomonas: POSITIVE — AB

## 2023-11-25 ENCOUNTER — Encounter: Payer: Self-pay | Admitting: Family Medicine

## 2023-11-26 ENCOUNTER — Ambulatory Visit: Payer: BC Managed Care – PPO | Admitting: Physician Assistant

## 2023-11-26 ENCOUNTER — Ambulatory Visit: Payer: Self-pay | Admitting: *Deleted

## 2023-11-26 VITALS — BP 116/73 | HR 71 | Resp 16 | Wt 265.0 lb

## 2023-11-26 DIAGNOSIS — M79672 Pain in left foot: Secondary | ICD-10-CM

## 2023-11-26 DIAGNOSIS — M79671 Pain in right foot: Secondary | ICD-10-CM

## 2023-11-26 DIAGNOSIS — M25572 Pain in left ankle and joints of left foot: Secondary | ICD-10-CM | POA: Diagnosis not present

## 2023-11-26 NOTE — Telephone Encounter (Signed)
Summary: pain in the bottom of her feet   Patient called stated she is in a lot pain in the bottom of her feet preventing her from standing or walking. She does not want to see any provider. Please f/u with patient     Attempted to call patient- message "call can not be completed at this time-try again later"

## 2023-11-26 NOTE — Telephone Encounter (Signed)
  Chief Complaint: bilateral foot pain Symptoms: pain increased- patient states it is severe and hard for her to walk Frequency: started last week Pertinent Negatives: Patient denies rash ,fever Disposition: [] ED /[] Urgent Care (no appt availability in office) / [x] Appointment(In office/virtual)/ []  [facility]/ [] Home Care/ [] Refused Recommended Disposition /[] [facility]/ []  Follow-up with PCP Additional Notes: Patient states she has been having pain in feet and is awaiting pain management appointment/scheduling- but something has changed and her is barely able to walk without pain.    Reason for Disposition  [1] SEVERE pain (e.g., excruciating, unable to do any normal activities) AND [2] not improved after 2 hours of pain medicine  Answer Assessment - Initial Assessment Questions 1. ONSET: "When did the pain start?"      Last week- hard to work 2. LOCATION: "Where is the pain located?"      Bilateral foot pain- bottom of feet 3. PAIN: "How bad is the pain?"    (Scale 1-10; or mild, moderate, severe)  - MILD (1-3): doesn't interfere with normal activities.   - MODERATE (4-7): interferes with normal activities (e.g., work or school) or awakens from sleep, limping.   - SEVERE (8-10): excruciating pain, unable to do any normal activities, unable to walk.      severe 4. WORK OR EXERCISE: "Has there been any recent work or exercise that involved this part of the body?"      Cold weather- worked Friday night- tingling started 5. CAUSE: "What do you think is causing the foot pain?"     Not sure 6. OTHER SYMPTOMS: "Do you have any other symptoms?" (e.g., leg pain, rash, fever, numbness)     Couple months before- patient fell in parking lot- fractured ankle- wore boot  Protocols used: Foot Pain-A-AH

## 2023-11-27 NOTE — Progress Notes (Signed)
Established patient visit  Patient: Heidi Kline   DOB: 1971/03/24   53 y.o. Female  MRN: [id number] Visit Date: 11/26/2023  Today's healthcare provider: Debera Lat, PA-C   Chief Complaint  Patient presents with   Pain in ankles       Subjective     HPI     Pain    Additional comments: Bilateral Foot Pain Symptoms: pain increased- patient states it is severe and hard for her to walk Frequency: Pain started worsening last week  Reports injury to ankle a few weeks back. Has taken pain medicines and muscle relaxer but pain is worse.      Last edited by Marjie Skiff, CMA on 11/26/2023  2:11 PM.        10/08/2023    2:52 PM 09/19/2023    1:42 PM 09/03/2023    4:17 PM  Depression screen PHQ 2/9  Decreased Interest 0 0 0  Down, Depressed, Hopeless 0 0 0  PHQ - 2 Score 0 0 0  Altered sleeping 0 0 0  Tired, decreased energy 0 0 0  Change in appetite 0 0 0  Feeling bad or failure about yourself  0 0 0  Trouble concentrating 0 0 0  Moving slowly or fidgety/restless 0 0 0  Suicidal thoughts 0 0 0  PHQ-9 Score 0 0 0  Difficult doing work/chores Not difficult at all Not difficult at all Not difficult at all      10/08/2023    2:52 PM 09/19/2023    1:43 PM 09/03/2023    4:17 PM  GAD 7 : Generalized Anxiety Score  Nervous, Anxious, on Edge 0 0 0  Control/stop worrying 0 0 0  Worry too much - different things 0 0 0  Trouble relaxing 0 0 0  Restless 0 0 0  Easily annoyed or irritable 0 0 0  Afraid - awful might happen 0 0 0  Total GAD 7 Score 0 0 0  Anxiety Difficulty Not difficult at all Not difficult at all Not difficult at all    Medications: Outpatient Medications Prior to Visit  Medication Sig   amLODipine (NORVASC) 5 MG tablet Take 1 tablet (5 mg total) by mouth daily.   famciclovir (FAMVIR) 250 MG tablet Take 1 tablet (250 mg total) by mouth 2 (two) times daily.   lisinopril-hydrochlorothiazide (ZESTORETIC) 20-12.5 MG tablet Take 1 tablet by  mouth daily.   tirzepatide [facility]) 5 MG/0.5ML Pen Inject 5 mg into the skin once a week.   tiZANidine (ZANAFLEX) 4 MG tablet Take 1 tablet (4 mg total) by mouth every 6 (six) hours as needed for muscle spasms.   [EXPIRED] metroNIDAZOLE (FLAGYL) 500 MG tablet Take 1 tablet (500 mg total) by mouth 2 (two) times daily for 7 days.   tirzepatide [facility]) 2.5 MG/0.5ML Pen Inject 2.5 mg into the skin once a week.   No facility-administered medications prior to visit.    Review of Systems All negative Except see HPI       Objective    BP 116/73 (BP Location: Right Arm, Patient Position: Sitting, Cuff Size: Large)   Pulse 71   Resp 16   Wt 265 lb (120.2 kg)   BMI 45.49 kg/m     Physical Exam Constitutional:      General: She is not in acute distress.    Appearance: Normal appearance.  HENT:     Head: Normocephalic.  Pulmonary:     Effort: Pulmonary effort is normal. No  respiratory distress.  Musculoskeletal:        General: Swelling (foot) and tenderness (foot) present.  Skin:    Capillary Refill: Capillary refill takes less than 2 seconds.  Neurological:     Mental Status: She is alert and oriented to person, place, and time. Mental status is at baseline.      No results found for any visits on 11/26/23.      Assessment and Plan Pain in both feet/ankles (Primary) Recurrent falls after an initial sprain Swelling, pain in feet/ankle, cannot bear weight Pt needs imaging Advised to proceed to [facility] for rapid evaluation, treatment and imaging. Pt expressed her understating and agreed to proceed to [facility].  No orders of the defined types were placed in this encounter.   No follow-ups on file.   The patient was advised to call back or seek an in-person evaluation if the symptoms worsen or if the condition fails to improve as anticipated.  I discussed the assessment and treatment plan with the patient. The patient was provided an opportunity to ask  questions and all were answered. The patient agreed with the plan and demonstrated an understanding of the instructions.  I, Debera Lat, PA-C have reviewed all documentation for this visit. The documentation on 11/26/2023  for the exam, diagnosis, procedures, and orders are all accurate and complete.  Debera Lat, [place], MMS [facility] [phone] (phone) [phone] (fax)  [facility]

## 2023-12-13 ENCOUNTER — Encounter: Payer: Self-pay | Admitting: Family Medicine

## 2023-12-13 ENCOUNTER — Other Ambulatory Visit (HOSPITAL_COMMUNITY)
Admission: RE | Admit: 2023-12-13 | Discharge: 2023-12-13 | Disposition: A | Discharge status: Home / Self Care | Payer: BC Managed Care – PPO | Source: Ambulatory Visit | Attending: Family Medicine | Admitting: Family Medicine

## 2023-12-13 ENCOUNTER — Ambulatory Visit: Payer: BC Managed Care – PPO | Admitting: Family Medicine

## 2023-12-13 ENCOUNTER — Telehealth: Payer: Self-pay | Admitting: Family Medicine

## 2023-12-13 VITALS — BP 133/65 | HR 79 | Ht 64.0 in | Wt 265.0 lb

## 2023-12-13 DIAGNOSIS — R1084 Generalized abdominal pain: Secondary | ICD-10-CM

## 2023-12-13 DIAGNOSIS — Z7985 Long-term (current) use of injectable non-insulin antidiabetic drugs: Secondary | ICD-10-CM

## 2023-12-13 DIAGNOSIS — G5792 Unspecified mononeuropathy of left lower limb: Secondary | ICD-10-CM

## 2023-12-13 DIAGNOSIS — I10 Essential (primary) hypertension: Secondary | ICD-10-CM

## 2023-12-13 DIAGNOSIS — N898 Other specified noninflammatory disorders of vagina: Secondary | ICD-10-CM | POA: Diagnosis not present

## 2023-12-13 DIAGNOSIS — E7800 Pure hypercholesterolemia, unspecified: Secondary | ICD-10-CM

## 2023-12-13 DIAGNOSIS — E1140 Type 2 diabetes mellitus with diabetic neuropathy, unspecified: Secondary | ICD-10-CM | POA: Insufficient documentation

## 2023-12-13 DIAGNOSIS — M79672 Pain in left foot: Secondary | ICD-10-CM

## 2023-12-13 MED ORDER — CELECOXIB 200 MG PO CAPS: 200.0000 mg | ORAL_CAPSULE | Freq: Two times a day (BID) | ORAL | 1 refills | Status: DC

## 2023-12-13 MED ORDER — TIRZEPATIDE 5 MG/0.5ML ~~LOC~~ SOAJ: 5.0000 mg | SUBCUTANEOUS | 1 refills | Status: AC

## 2023-12-13 MED ORDER — DICLOFENAC SODIUM 75 MG PO TBEC: 75.0000 mg | DELAYED_RELEASE_TABLET | Freq: Two times a day (BID) | ORAL | 0 refills | Status: DC

## 2023-12-13 NOTE — Assessment & Plan Note (Addendum)
 On Mounjaro  5 mg weekly. Medication not received due to insurance issues. Plans to resubmit prescription and pursue prior authorization. Discussed importance of weight management. Previous experience with Ozempic resulted in significant weight loss but adverse effects at higher doses.  The 10-year ASCVD risk score (Arnett DK, et al., 2019) is: 11.7%  Chronic, well controlled, recent diagnosis  - Resubmit prescription for Mounjaro  5mg  weekly - Check A1c to support prior authorization

## 2023-12-13 NOTE — Assessment & Plan Note (Signed)
 Well-controlled with amlodipine  5 mg, lisinopril  20 mg, and hydrochlorothiazide  12.5 mg. Blood pressure today is 133/65 mmHg. Chronic, at goal  - Continue current antihypertensive medications -CMP collected today
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
## 2023-12-13 NOTE — Telephone Encounter (Signed)
 Covermymeds is requesting prior authorization Key: [facility] Mounjaro 5mg /0.5ML auto injectors

## 2023-12-14 ENCOUNTER — Encounter: Payer: Self-pay | Admitting: Family Medicine

## 2023-12-14 LAB — CMP14+EGFR
ALT: 76 [IU]/L — ABNORMAL HIGH (ref 0–32)
AST: 48 [IU]/L — ABNORMAL HIGH (ref 0–40)
Albumin: 4.1 g/dL (ref 3.8–4.9)
Alkaline Phosphatase: 109 [IU]/L (ref 44–121)
BUN/Creatinine Ratio: 15 (ref 9–23)
BUN: 18 mg/dL (ref 6–24)
Bilirubin Total: <0.2 mg/dL (ref 0.0–1.2)
CO2: 25 mmol/L (ref 20–29)
Calcium: 9.4 mg/dL (ref 8.7–10.2)
Chloride: 105 mmol/L (ref 96–106)
Creatinine, Ser: 1.21 mg/dL — ABNORMAL HIGH (ref 0.57–1.00)
Globulin, Total: 2.3 g/dL (ref 1.5–4.5)
Glucose: 103 mg/dL — ABNORMAL HIGH (ref 70–99)
Potassium: 3.8 mmol/L (ref 3.5–5.2)
Sodium: 146 mmol/L — ABNORMAL HIGH (ref 134–144)
Total Protein: 6.4 g/dL (ref 6.0–8.5)
eGFR: 53 mL/min/{1.73_m2} — ABNORMAL LOW (ref >59)

## 2023-12-14 LAB — CBC
Hematocrit: 36.5 % (ref 34.0–46.6)
Hemoglobin: 11.2 g/dL (ref 11.1–15.9)
MCH: 26.8 pg (ref 26.6–33.0)
MCHC: 30.7 g/dL — ABNORMAL LOW (ref 31.5–35.7)
MCV: 87 fL (ref 79–97)
Platelets: 369 10*3/uL (ref 150–450)
RBC: 4.18 x10E6/uL (ref 3.77–5.28)
RDW: 13.2 % (ref 11.7–15.4)
WBC: 7.1 10*3/uL (ref 3.4–10.8)

## 2023-12-14 LAB — CERVICOVAGINAL ANCILLARY ONLY
Bacterial Vaginitis (gardnerella): NEGATIVE
Candida Glabrata: NEGATIVE
Candida Vaginitis: NEGATIVE
Chlamydia: NEGATIVE
Comment: NEGATIVE
Comment: NEGATIVE
Comment: NEGATIVE
Comment: NEGATIVE
Comment: NEGATIVE
Comment: NORMAL
Neisseria Gonorrhea: NEGATIVE
Trichomonas: NEGATIVE

## 2023-12-19 ENCOUNTER — Telehealth: Payer: Self-pay | Admitting: Family Medicine

## 2023-12-19 ENCOUNTER — Other Ambulatory Visit (HOSPITAL_COMMUNITY): Payer: Self-pay

## 2023-12-19 ENCOUNTER — Telehealth: Payer: Self-pay

## 2023-12-19 NOTE — Telephone Encounter (Signed)
Pharmacy Patient Advocate Encounter   Received notification from Pt Calls Messages that prior authorization for Mounjaro 5MG /0.5ML auto-injectors is required/requested.   Insurance verification completed.   The patient is insured through  [facility]  .   Per test claim: PA required; PA submitted to above mentioned insurance via [facility] on the phone Key/confirmation #/EOC [id number] Status is pending   *[facility] will be faxing a prior auth form for [place] to fill out and fax back. They no longer use CMM.

## 2023-12-19 NOTE — Telephone Encounter (Signed)
Covermymeds is requesting prior authorization Key: [facility] Mounjaro 65M/0.65ML auto injectors

## 2023-12-24 NOTE — Telephone Encounter (Signed)
 PA form completed  Attached visit note from 12/13/23

## 2023-12-24 NOTE — Telephone Encounter (Signed)
 Fax received from [facility] and placed in Dr. Danella Penton box

## 2023-12-27 ENCOUNTER — Other Ambulatory Visit (HOSPITAL_COMMUNITY): Payer: Self-pay

## 2024-01-01 ENCOUNTER — Telehealth: Payer: Self-pay

## 2024-01-01 NOTE — Telephone Encounter (Signed)
 Copied from CRM [id number]. Topic: Clinical - Medication Question >> Jan 01, 2024  8:28 AM Everette C wrote: Reason for CRM: The patient has called with additional concerns related to their prescription of tirzepatide [facility]) 5 MG/0.5ML Pen [[id number]]  The patient has called to follow up with a member of staff on the status of their prior authorization for the prescription. The patient shares that they have been unable to pick up their prescription and have additional concerns related to prior authorization   Please contact the patient further when possible

## 2024-01-02 ENCOUNTER — Other Ambulatory Visit (HOSPITAL_COMMUNITY): Payer: Self-pay

## 2024-01-08 ENCOUNTER — Other Ambulatory Visit (HOSPITAL_COMMUNITY): Payer: Self-pay

## 2024-01-09 ENCOUNTER — Telehealth: Payer: Self-pay | Admitting: Pharmacist

## 2024-01-09 NOTE — Telephone Encounter (Signed)
 I received a request to submit an appeal for Mounjaro 5MG /0.5ML auto-injectors.  In order to complete this appeal, I would need a copy of the insurance denial letter.  Are you able to upload this to the patient's media tab or fax to me at [phone]?  Thank you, Dellie Burns, PharmD Clinical Pharmacist  [place]  Direct Dial: [phone]

## 2024-01-14 ENCOUNTER — Other Ambulatory Visit (HOSPITAL_COMMUNITY): Payer: Self-pay

## 2024-01-15 ENCOUNTER — Telehealth: Payer: Self-pay | Admitting: Pharmacist

## 2024-01-15 ENCOUNTER — Other Ambulatory Visit (HOSPITAL_COMMUNITY): Payer: Self-pay

## 2024-01-15 NOTE — Telephone Encounter (Signed)
 Appeal has been submitted for [facility]. Will advise when response is received, please be advised that most companies may take 30 days to make a decision. Appeal letter and supporting documentation have been faxed to [phone] on 01/15/2024 @11 :53 am.  Thank you, Dellie Burns, PharmD Clinical Pharmacist  [place]  Direct Dial: [phone]

## 2024-01-23 ENCOUNTER — Other Ambulatory Visit (HOSPITAL_COMMUNITY): Payer: Self-pay

## 2024-01-24 ENCOUNTER — Ambulatory Visit (INDEPENDENT_AMBULATORY_CARE_PROVIDER_SITE_OTHER): Payer: BC Managed Care – PPO | Admitting: Family Medicine

## 2024-01-24 ENCOUNTER — Encounter: Payer: Self-pay | Admitting: Family Medicine

## 2024-01-24 VITALS — BP 114/73 | HR 83 | Ht 60.0 in | Wt 264.0 lb

## 2024-01-24 DIAGNOSIS — R5383 Other fatigue: Secondary | ICD-10-CM

## 2024-01-24 DIAGNOSIS — E7800 Pure hypercholesterolemia, unspecified: Secondary | ICD-10-CM

## 2024-01-24 DIAGNOSIS — I10 Essential (primary) hypertension: Secondary | ICD-10-CM | POA: Diagnosis not present

## 2024-01-24 DIAGNOSIS — E1140 Type 2 diabetes mellitus with diabetic neuropathy, unspecified: Secondary | ICD-10-CM

## 2024-01-24 DIAGNOSIS — R0683 Snoring: Secondary | ICD-10-CM | POA: Diagnosis not present

## 2024-01-24 NOTE — Assessment & Plan Note (Addendum)
 Type 2 Diabetes Mellitus Type 2 Diabetes Mellitus is well controlled with an A1c of 6.6%. An updated A1c is due today. There is an ongoing issue with insurance approval for Mounjaro 5 mg, and an appeal is in process. She is considering alternatives like Zepbound, which may require a sleep study for insurance coverage. She previously used Ozempic but experienced adverse effects at higher doses. Mounjaro and Zepbound are expected to have similar effects on weight loss, with potential for nausea. The sleep study may provide an indication for Zepbound coverage if sleep apnea is confirmed. - ordered A1c test today - Await insurance appeal decision for [facility] - Consider sleep study for potential Zepbound coverage - Discuss potential use of Mounjaro or Zepbound based on insurance approval and sleep study results

## 2024-01-24 NOTE — Progress Notes (Signed)
 Established patient visit   Patient: Heidi Kline   DOB: 1971/04/07   53 y.o. Female  MRN: [id number] Visit Date: 01/24/2024  Today's healthcare provider: [name], MD   Chief Complaint  Patient presents with   Diabetes    She never got the medication to start, discuss new treatment plan   Subjective     HPI     Diabetes    Additional comments: She never got the medication to start, discuss new treatment plan      Last edited by Thedora Hinders, CMA on 01/24/2024 11:10 AM.       Discussed the use of AI scribe software for clinical note transcription with the patient, who gave verbal consent to proceed.  History of Present Illness Heidi Kline "Arline Asp" is a 53 year old female with diabetes who presents for a follow-up on diabetes treatment.  Her diabetes is well-controlled with an A1c of 6.6, and she is due for an updated A1c today. She is currently awaiting an appeal for prior authorization for Mounjaro 5 mg. She continues to take amlodipine 5 mg, lisinopril 20 mg, and hydrochlorothiazide 12.5 mg daily, with her blood pressure well controlled at 114/73.  She experiences significant fatigue and low energy levels, describing her energy as 'just through the roof' and feeling 'tired'. No appetite changes, and her weight is stable despite limited food intake.  She has persistent and increasing foot pain. She recently started taking Celebrex after completing a course of diclofenac. She is concerned about the impact of these medications on her stomach and kidneys, especially given her eating habits, which she describes as not being a big eater, often eating only one meal a day and snacking on potato chips. She acknowledges that her diet may not be providing adequate nutrition.  She reports snoring all the time, significant enough that her children close the door.  She works long hours, often 12 to 16-hour shifts, and describes her job as  physically demanding. She lives close to her workplace, allowing her to go home briefly during breaks. She describes herself as a 'Dispensing optician' and not a big eater, often eating only one meal a day. She prefers regular potato chips and acknowledges that her diet may not be healthy. She is concerned about the cost of additional healthcare services due to her current insurance situation.   Past Medical History:  Diagnosis Date   Hypertension    Neuropathy     Medications: Outpatient Medications Prior to Visit  Medication Sig   acyclovir (ZOVIRAX) 400 MG tablet Take 400 mg by mouth 2 (two) times daily.   amLODipine (NORVASC) 5 MG tablet Take 1 tablet (5 mg total) by mouth daily.   celecoxib (CELEBREX) 200 MG capsule Take 1 capsule (200 mg total) by mouth 2 (two) times daily.   lisinopril-hydrochlorothiazide (ZESTORETIC) 20-12.5 MG tablet Take 1 tablet by mouth daily.   tiZANidine (ZANAFLEX) 4 MG tablet Take 1 tablet (4 mg total) by mouth every 6 (six) hours as needed for muscle spasms.   diclofenac (VOLTAREN) 75 MG EC tablet Take 1 tablet (75 mg total) by mouth 2 (two) times daily. (Patient not taking: Reported on 01/24/2024)   famciclovir (FAMVIR) 250 MG tablet Take 1 tablet (250 mg total) by mouth 2 (two) times daily. (Patient not taking: Reported on 01/24/2024)   tirzepatide [facility]) 5 MG/0.5ML Pen Inject 5 mg into the skin once a week. (Patient not taking: Reported on 01/24/2024)  No facility-administered medications prior to visit.    Review of Systems  Last metabolic panel Lab Results  Component Value Date   GLUCOSE 103 (H) 12/13/2023   NA 146 (H) 12/13/2023   K 3.8 12/13/2023   CL 105 12/13/2023   CO2 25 12/13/2023   BUN 18 12/13/2023   CREATININE 1.21 (H) 12/13/2023   EGFR 53 (L) 12/13/2023   CALCIUM 9.4 12/13/2023   PROT 6.4 12/13/2023   ALBUMIN 4.1 12/13/2023   LABGLOB 2.3 12/13/2023   AGRATIO 2.0 03/29/2023   BILITOT <0.2 12/13/2023   ALKPHOS 109 12/13/2023   AST  48 (H) 12/13/2023   ALT 76 (H) 12/13/2023   Last lipids Lab Results  Component Value Date   CHOL 211 (H) 07/16/2023   HDL 60 07/16/2023   LDLCALC 137 (H) 07/16/2023   TRIG 79 07/16/2023   CHOLHDL 3.5 07/16/2023   Last hemoglobin A1c Lab Results  Component Value Date   HGBA1C 6.6 (H) 07/16/2023        Objective    BP 114/73   Pulse 83   Ht 5' (1.524 m)   Wt 264 lb (119.7 kg)   SpO2 100%   BMI 51.56 kg/m    BP Readings from Last 3 Encounters:  01/24/24 114/73  12/13/23 133/65  11/26/23 116/73   Wt Readings from Last 3 Encounters:  01/24/24 264 lb (119.7 kg)  12/13/23 265 lb (120.2 kg)  11/26/23 265 lb (120.2 kg)     Physical Exam  General: Alert, no acute distress Cardio: Normal S1 and S2, RRR, no r/m/g Pulm: CTAB, normal work of breathing    No results found for any visits on 01/24/24.  Assessment & Plan     Problem List Items Addressed This Visit       Cardiovascular and Mediastinum   Hypertension   Hypertension is well controlled with current, chronic medication regimen. Blood pressure is 114/73 mmHg. - Continue amlodipine 5 mg daily - Continue lisinopril 20 mg daily - Continue hydrochlorothiazide 12.5 mg daily        Endocrine   Type 2 diabetes mellitus with diabetic neuropathy, without long-term current use of insulin (HCC) - Primary   Type 2 Diabetes Mellitus Type 2 Diabetes Mellitus is well controlled with an A1c of 6.6%. An updated A1c is due today. There is an ongoing issue with insurance approval for Mounjaro 5 mg, and an appeal is in process. She is considering alternatives like Zepbound, which may require a sleep study for insurance coverage. She previously used Ozempic but experienced adverse effects at higher doses. Mounjaro and Zepbound are expected to have similar effects on weight loss, with potential for nausea. The sleep study may provide an indication for Zepbound coverage if sleep apnea is confirmed. - ordered A1c test today -  Await insurance appeal decision for [facility] - Consider sleep study for potential Zepbound coverage - Discuss potential use of Mounjaro or Zepbound based on insurance approval and sleep study results      Relevant Orders   Hemoglobin A1c     Other   Snoring   Relevant Orders   Ambulatory referral to Sleep Studies   HLD (hyperlipidemia)   Other Visit Diagnoses       Decreased energy       Relevant Orders   Vitamin B12   TSH+T4F+T3Free   CBC   VITAMIN D 25 Hydroxy (Vit-D Deficiency, Fractures)        Assessment & Plan   Sleep Apnea (suspected) Frequent  snoring suggests possible sleep apnea. A sleep study is being considered to confirm the diagnosis and potentially facilitate insurance coverage for Zepbound. The sleep study may also provide an indication for [facility] coverage if sleep apnea is confirmed. - Arrange for a sleep study to evaluate for sleep apnea   Decreased energy  Ordered B12, Vitamin D, CBC, TSH   Chronic Pain She experiences chronic pain, particularly in the foot. She has been using diclofenac and recently started Celebrex, which is considered better for long-term use due to its gastrointestinal safety profile compared to diclofenac. Monitoring kidney function is important due to potential side effects. - Continue Celebrex for long-term pain management and follow up with orthopedics as scheduled  - Monitor for gastrointestinal side effects and kidney function  Nutritional Concerns She reports low energy levels and poor appetite, consuming primarily snacks like potato chips. She is considering protein drinks as a meal replacement. There is a concern about adequate nutrition and potential weight management issues. She is not interested in seeing a nutritionist due to financial constraints. - Consider protein drinks with low sugar content as meal replacements - Encourage balanced nutrition with adequate protein intake     Return in about 1 month (around  02/24/2024) for DM, zepbound.         [name], MD  [facility] [phone] (phone) [phone] (fax)  [facility]

## 2024-01-24 NOTE — Assessment & Plan Note (Signed)
 Hypertension is well controlled with current, chronic medication regimen. Blood pressure is 114/73 mmHg. - Continue amlodipine 5 mg daily - Continue lisinopril 20 mg daily - Continue hydrochlorothiazide 12.5 mg daily

## 2024-01-25 ENCOUNTER — Encounter: Payer: Self-pay | Admitting: Family Medicine

## 2024-01-25 LAB — CBC
Hematocrit: 35.3 % (ref 34.0–46.6)
Hemoglobin: 11 g/dL — ABNORMAL LOW (ref 11.1–15.9)
MCH: 27.2 pg (ref 26.6–33.0)
MCHC: 31.2 g/dL — ABNORMAL LOW (ref 31.5–35.7)
MCV: 87 fL (ref 79–97)
Platelets: 331 10*3/uL (ref 150–450)
RBC: 4.04 x10E6/uL (ref 3.77–5.28)
RDW: 12.8 % (ref 11.7–15.4)
WBC: 6.5 10*3/uL (ref 3.4–10.8)

## 2024-01-25 LAB — TSH+T4F+T3FREE
Free T4: 1.04 ng/dL (ref 0.82–1.77)
T3, Free: 3 pg/mL (ref 2.0–4.4)
TSH: 0.989 u[IU]/mL (ref 0.450–4.500)

## 2024-01-25 LAB — HEMOGLOBIN A1C
Est. average glucose Bld gHb Est-mCnc: 146 mg/dL
Hgb A1c MFr Bld: 6.7 % — ABNORMAL HIGH (ref 4.8–5.6)

## 2024-01-25 LAB — VITAMIN B12: Vitamin B-12: 490 pg/mL (ref 232–1245)

## 2024-01-25 LAB — VITAMIN D 25 HYDROXY (VIT D DEFICIENCY, FRACTURES): Vit D, 25-Hydroxy: 30.5 ng/mL (ref 30.0–100.0)

## 2024-01-29 ENCOUNTER — Ambulatory Visit: Payer: Self-pay

## 2024-01-29 NOTE — Telephone Encounter (Signed)
 This RN attempted return call attempt #3. No answer. Will forward to clinic.

## 2024-01-29 NOTE — Telephone Encounter (Signed)
 FYI 3 attempt has been made

## 2024-01-29 NOTE — Telephone Encounter (Signed)
 First attempt to contact pt, left VM.  Copied from CRM [id number]. Topic: General - Other >> Jan 29, 2024  2:49 PM Heidi Kline wrote: Please call patient.. having pain in her foot and needs something today she says.. celecoxib (CELEBREX) 200 MG capsule diclofenac (VOLTAREN) 75 MG EC tablet Both above is causing her stomach pain and needs alternative to it. Her# [phone]

## 2024-01-29 NOTE — Telephone Encounter (Signed)
 This RN attempted return call #2. No answer. LVM.

## 2024-01-30 ENCOUNTER — Ambulatory Visit: Payer: Self-pay | Admitting: *Deleted

## 2024-01-30 NOTE — Telephone Encounter (Signed)
  Chief Complaint: left foot pain worsening, requesting different medication due to celebrex causing GI upset and pain level high had to leave work early yesterday and today  Symptoms: left foot pain worsening. Unable to tolerate celebrex due to "messed up my stomach". Severe pain now , limping to walk. No OTC medication is helping with pain Frequency: yesterday  Pertinent Negatives: Patient denies fever, no swelling or redness reported in foot Disposition: [] ED /[] Urgent Care (no appt availability in office) / [] Appointment(In office/virtual)/ []  [facility]/ [] Home Care/ [] Refused Recommended Disposition /[] [facility]/ [x]  Follow-up with PCP Additional Notes:   Patient seen last week for same issue and requesting a different medication for pain than celebrex. Please advise.  Recommended if pain worsens may need to go to UC /ED.       Copied from CRM [id number]. Topic: Clinical - Red Word Triage >> Jan 30, 2024  4:08 PM Fredrica W wrote: Red Word that prompted transfer to Nurse Triage: Pain in foot requesting medication - missed call from Nurse 3/25 Reason for Disposition  [1] SEVERE pain (e.g., excruciating, unable to do any normal activities) AND [2] not improved after 2 hours of pain medicine  Answer Assessment - Initial Assessment Questions 1. ONSET: "When did the pain start?"      Yesterday  2. LOCATION: "Where is the pain located?"      Left foot  3. PAIN: "How bad is the pain?"    (Scale 1-10; or mild, moderate, severe)  - MILD (1-3): doesn't interfere with normal activities.   - MODERATE (4-7): interferes with normal activities (e.g., work or school) or awakens from sleep, limping.   - SEVERE (8-10): excruciating pain, unable to do any normal activities, unable to walk.      Limping had to leave work severe 4. WORK OR EXERCISE: "Has there been any recent work or exercise that involved this part of the body?"      Na  5. CAUSE: "What do you think is  causing the foot pain?"     See previous encounters 6. OTHER SYMPTOMS: "Do you have any other symptoms?" (e.g., leg pain, rash, fever, numbness)     Left foot pain  7. PREGNANCY: "Is there any chance you are pregnant?" "When was your last menstrual period?"     na  Protocols used: Foot Pain-A-AH

## 2024-01-31 NOTE — Telephone Encounter (Signed)
Appeal denied by insurance.

## 2024-02-01 NOTE — Telephone Encounter (Signed)
 FYI

## 2024-02-04 ENCOUNTER — Other Ambulatory Visit: Payer: Self-pay

## 2024-02-04 ENCOUNTER — Ambulatory Visit: Admitting: Sleep Medicine

## 2024-02-04 MED ORDER — ETODOLAC 200 MG PO CAPS: 200.0000 mg | ORAL_CAPSULE | Freq: Three times a day (TID) | ORAL | 1 refills | Status: DC

## 2024-02-05 ENCOUNTER — Telehealth: Payer: Self-pay

## 2024-02-05 NOTE — Telephone Encounter (Signed)
 Previously referred for sleep study with appt on 02/04/24 that appears to have been cancelled.   Is patient able to call and reschedule a home sleep evaluation with previous referral?

## 2024-02-05 NOTE — Telephone Encounter (Signed)
 Copied from CRM [id number]. Topic: Referral - Question >> Feb 04, 2024  8:15 AM Clayton Bibles wrote: Reason for CRM:  Arline Asp would like to complete the sleep study at home. Please give Arline Asp a call about the referral and when she can complete this. She fell at a ball game last November and hurt her left foot. She went to the emergency room due to the pain. Her pain is taken care of with medication. She does not want an appointment with her primary doctor or want to talk to a nurse.

## 2024-02-07 ENCOUNTER — Encounter: Payer: Self-pay | Admitting: Family Medicine

## 2024-02-29 ENCOUNTER — Other Ambulatory Visit: Payer: Self-pay | Admitting: Family Medicine

## 2024-02-29 DIAGNOSIS — G5792 Unspecified mononeuropathy of left lower limb: Secondary | ICD-10-CM

## 2024-02-29 DIAGNOSIS — M79672 Pain in left foot: Secondary | ICD-10-CM

## 2024-02-29 NOTE — Telephone Encounter (Signed)
 Requested Prescriptions  Refused Prescriptions Disp Refills   diclofenac  (VOLTAREN ) 75 MG EC tablet [Pharmacy Med Name: DICLOFENAC  SOD EC 75 MG TAB] 30 tablet 0    Sig: TAKE 1 TABLET BY MOUTH TWICE A DAY     Analgesics:  NSAIDS Failed - 02/29/2024  3:01 PM      Failed - Manual Review: Labs are only required if the patient has taken medication for more than 8 weeks.      Failed - Cr in normal range and within 360 days    Creatinine, Ser  Date Value Ref Range Status  12/13/2023 1.21 (H) 0.57 - 1.00 mg/dL Final         Failed - HGB in normal range and within 360 days    Hemoglobin  Date Value Ref Range Status  01/24/2024 11.0 (L) 11.1 - 15.9 g/dL Final         Passed - PLT in normal range and within 360 days    Platelets  Date Value Ref Range Status  01/24/2024 331 150 - 450 x10E3/uL Final         Passed - HCT in normal range and within 360 days    Hematocrit  Date Value Ref Range Status  01/24/2024 35.3 34.0 - 46.6 % Final         Passed - eGFR is 30 or above and within 360 days    eGFR  Date Value Ref Range Status  12/13/2023 53 (L) >59 mL/min/1.73 Final         Passed - Patient is not pregnant      Passed - Valid encounter within last 12 months    Recent Outpatient Visits           1 month ago Type 2 diabetes mellitus with diabetic neuropathy, without long-term current use of insulin (HCC)   [facility] Simmons-Robinson, [place], MD   2 months ago Pure hypercholesterolemia   [facility] [place], Judyann Number, MD

## 2024-03-18 ENCOUNTER — Telehealth: Payer: Self-pay

## 2024-03-18 NOTE — Telephone Encounter (Signed)
 Patient was advised that she would need an appointment for this type of treatment.  She said she had just tried drinking some cranberry juice and if it didn't help she would make and appointment    Copied from CRM [id number]. Topic: Clinical - Medication Question >> Mar 17, 2024  4:01 PM Georgeann Kindred wrote: Reason for CRM: Patient is requesting a prescription for Septra for a UTI. Please contact patient at [phone] for additional information.

## 2024-03-31 ENCOUNTER — Other Ambulatory Visit: Payer: Self-pay | Admitting: Family Medicine

## 2024-04-03 ENCOUNTER — Ambulatory Visit: Admitting: Physician Assistant

## 2024-04-03 ENCOUNTER — Ambulatory Visit: Payer: Self-pay

## 2024-04-03 ENCOUNTER — Encounter: Payer: Self-pay | Admitting: Physician Assistant

## 2024-04-03 ENCOUNTER — Other Ambulatory Visit (HOSPITAL_COMMUNITY)
Admission: RE | Admit: 2024-04-03 | Discharge: 2024-04-03 | Disposition: A | Discharge status: Home / Self Care | Source: Ambulatory Visit | Attending: Physician Assistant | Admitting: Physician Assistant

## 2024-04-03 VITALS — BP 82/66 | HR 76 | Resp 16 | Ht 61.0 in | Wt 264.0 lb

## 2024-04-03 DIAGNOSIS — I959 Hypotension, unspecified: Secondary | ICD-10-CM | POA: Insufficient documentation

## 2024-04-03 DIAGNOSIS — I9589 Other hypotension: Secondary | ICD-10-CM

## 2024-04-03 DIAGNOSIS — K5909 Other constipation: Secondary | ICD-10-CM

## 2024-04-03 DIAGNOSIS — R399 Unspecified symptoms and signs involving the genitourinary system: Secondary | ICD-10-CM

## 2024-04-03 DIAGNOSIS — I10 Essential (primary) hypertension: Secondary | ICD-10-CM

## 2024-04-03 DIAGNOSIS — N3000 Acute cystitis without hematuria: Secondary | ICD-10-CM | POA: Insufficient documentation

## 2024-04-03 DIAGNOSIS — N898 Other specified noninflammatory disorders of vagina: Secondary | ICD-10-CM | POA: Insufficient documentation

## 2024-04-03 DIAGNOSIS — R42 Dizziness and giddiness: Secondary | ICD-10-CM

## 2024-04-03 DIAGNOSIS — Z113 Encounter for screening for infections with a predominantly sexual mode of transmission: Secondary | ICD-10-CM

## 2024-04-03 LAB — POCT URINALYSIS DIPSTICK
Bilirubin, UA: NEGATIVE
Blood, UA: NEGATIVE
Color, UA: NEGATIVE
Glucose, UA: NEGATIVE
Leukocytes, UA: NEGATIVE
Nitrite, UA: NEGATIVE
Protein, UA: NEGATIVE
Spec Grav, UA: 1.02 (ref 1.010–1.025)
Urobilinogen, UA: 0.2 U/dL
pH, UA: 6 (ref 5.0–8.0)

## 2024-04-03 NOTE — Progress Notes (Signed)
 Established patient visit  Patient: Heidi Kline   DOB: [birth year]   53 y.o. Female  MRN: [id number] Visit Date: 04/03/2024  Today's healthcare provider: Blane Bunting, PA-C   Chief Complaint  Patient presents with   Abdominal Pain    ABD pain with fainting for  couple weeks Lightheaded and dizzy.   Subjective     HPI     Abdominal Pain    Additional comments: ABD pain with fainting for  couple weeks Lightheaded and dizzy.      Last edited by Estill Hemming, CMA on 04/03/2024  2:54 PM.       Discussed the use of AI scribe software for clinical note transcription with the patient, who gave verbal consent to proceed.  History of Present Illness Heidi Kline "Heidi Kline" is a 53 year old female who presents with abdominal pain and numbness. She is accompanied by her child.  Abdominal pain has been present for a couple of months, initially resembling a urinary tract infection with dysuria. Cranberry juice consumption has reduced urinary frequency and urgency. Pain is localized to the mid-abdomen without cramping or radiation. No history of nephrolithiasis.  Numbness and tingling in legs and hands are significant, especially at night. She has neuropathy, which may be exacerbated by current symptoms.  She experienced syncope yesterday in a grocery store, preceded by dizziness. She suspects dehydration and is concerned about low blood pressure contributing to these symptoms. Current medications include amlodipine , lisinopril , and hydrochlorothiazide .  No fever, hematuria, or vaginal discharge. She is not sexually active and is unconcerned about STIs.       01/24/2024   11:13 AM 12/13/2023    9:04 AM 10/08/2023    2:52 PM  Depression screen PHQ 2/9  Decreased Interest 0 0 0  Down, Depressed, Hopeless 0 0 0  PHQ - 2 Score 0 0 0  Altered sleeping   0  Tired, decreased energy   0  Change in appetite   0  Feeling bad or failure about yourself    0  Trouble concentrating    0  Moving slowly or fidgety/restless   0  Suicidal thoughts   0  PHQ-9 Score   0  Difficult doing work/chores   Not difficult at all      01/24/2024   11:13 AM 12/13/2023    9:04 AM 10/08/2023    2:52 PM 09/19/2023    1:43 PM  GAD 7 : Generalized Anxiety Score  Nervous, Anxious, on Edge 0 0 0 0  Control/stop worrying 0 0 0 0  Worry too much - different things 0 0 0 0  Trouble relaxing 0 0 0 0  Restless 0 0 0 0  Easily annoyed or irritable 0 0 0 0  Afraid - awful might happen 0 0 0 0  Total GAD 7 Score 0 0 0 0  Anxiety Difficulty Not difficult at all Not difficult at all Not difficult at all Not difficult at all    Medications: Outpatient Medications Prior to Visit  Medication Sig   amLODipine  (NORVASC ) 5 MG tablet Take 1 tablet (5 mg total) by mouth daily.   celecoxib  (CELEBREX ) 200 MG capsule Take 1 capsule (200 mg total) by mouth 2 (two) times daily.   etodolac  (LODINE ) 200 MG capsule TAKE 1 CAPSULE (200 MG TOTAL) BY MOUTH EVERY 8 HOURS   famciclovir  (FAMVIR ) 250 MG tablet Take 1 tablet (250 mg total) by mouth 2 (two) times daily.   lisinopril -hydrochlorothiazide  (ZESTORETIC ) 20-12.5  MG tablet Take 1 tablet by mouth daily.   tirzepatide (MOUNJARO) 5 MG/0.5ML Pen Inject 5 mg into the skin once a week.   tiZANidine  (ZANAFLEX ) 4 MG tablet Take 1 tablet (4 mg total) by mouth every 6 (six) hours as needed for muscle spasms.   acyclovir  (ZOVIRAX ) 400 MG tablet TAKE 1 TABLET BY MOUTH TWICE A DAY (Patient not taking: Reported on 04/03/2024)   No facility-administered medications prior to visit.    Review of Systems All negative Except see HPI       Objective    BP (!) 82/66 (BP Location: Left Arm, Patient Position: Sitting, Cuff Size: Normal)   Pulse 76   Resp 16   Ht 5\' 1"  (1.549 m)   Wt 264 lb (119.7 kg)   SpO2 100%   BMI 49.88 kg/m     Physical Exam Vitals reviewed.  Constitutional:      General: She is not in acute distress.    Appearance: Normal appearance.  She is well-developed. She is not diaphoretic.  HENT:     Head: Normocephalic and atraumatic.  Eyes:     General: No scleral icterus.    Conjunctiva/sclera: Conjunctivae normal.  Neck:     Thyroid : No thyromegaly.  Cardiovascular:     Rate and Rhythm: Normal rate and regular rhythm.     Pulses: Normal pulses.     Heart sounds: Normal heart sounds. No murmur heard. Pulmonary:     Effort: Pulmonary effort is normal. No respiratory distress.     Breath sounds: Normal breath sounds. No wheezing, rhonchi or rales.  Musculoskeletal:     Cervical back: Neck supple.     Right lower leg: No edema.     Left lower leg: No edema.  Lymphadenopathy:     Cervical: No cervical adenopathy.  Skin:    General: Skin is warm and dry.     Findings: No rash.  Neurological:     Mental Status: She is alert and oriented to person, place, and time. Mental status is at baseline.  Psychiatric:        Mood and Affect: Mood normal.        Behavior: Behavior normal.      No results found for any visits on 04/03/24.      Assessment & Plan Dizziness and syncope Could be due to orthostatic hypotension due to antihypertensive medications or due to dehydration. - Decrease lisinopril /hydrochlorothiazide  dose by half temporarily. - Monitor blood pressure regularly. - Encourage adequate hydration. Will need to reassess in 2 weeks  Essential hypertension Chronic Current low blood pressure likely due to medication regimen. Amlodipine  5, zestoretic  20-12.5 - Temporarily reduce lisinopril /hydrochlorothiazide  dosage. - Monitor blood pressure closely. Adhere to adequate hydration, healthy diet Will reassess  Urinary tract infection symptoms Symptoms suggestive of UTI with possible bladder involvement. - Order urine culture and microscopy. - Advise increased water intake and reduce cranberry juice consumption. - Seek urgent care if symptoms worsen or if unable to see primary care provider  promptly.  Peripheral neuropathy Worsening numbness and tingling, possibly exacerbated by UTI. - Monitor symptoms and reassess after UTI treatment. Will reassess at the follow-up  Urinary tract infection symptoms (Primary)  - POCT urinalysis dipstick negative for infection - Urine Culture to rule out uti - Cervicovaginal ancillary only  Vaginal discharge Screening for STD In March, underwent a treatment for trichomoniasis Request a proper assessment - Cervicovaginal ancillary only - RPR - HIV antibody (with reflex) Will follow-up   Primary  hypertension  - CBC with Differential/Platelet - Basic metabolic panel with GFR  Other specified hypotension Acute Would temporarily change a dose of hydrochlorothiazide -lisinopril  to 1/2 Continue to monitor BP at home and return to clinic  Other constipation Chronic Pt was advised: -Eliminate medications that cause constipation. -Increase fluid intake. -Increase soluble fiber (25-30g/day) in diet. -Encourage regular defecation attempts after eating. -Regular exercise -Enemas if other methods fail- Bulking agents (accompanied by adequate fluids)  Psyllium  (Konsyl, Metamucil, Perdiem Fiber): 1 tbsp (approx 3.5 grams fiber) in 8-oz liquid PO daily up to TID Pt instructions/high-fiber eating plan were provided   Orders Placed This Encounter  Procedures   Urine Culture   Urinalysis, microscopic only   POCT urinalysis dipstick    No follow-ups on file.   The patient was advised to call back or seek an in-person evaluation if the symptoms worsen or if the condition fails to improve as anticipated.  I discussed the assessment and treatment plan with the patient. The patient was provided an opportunity to ask questions and all were answered. The patient agreed with the plan and demonstrated an understanding of the instructions.  I, Zanyla Klebba, PA-C have reviewed all documentation for this visit. The documentation on 04/03/2024   for the exam, diagnosis, procedures, and orders are all accurate and complete.  Blane Bunting, [place], MMS [facility] [phone] (phone) [phone] (fax)  [facility]

## 2024-04-03 NOTE — Telephone Encounter (Signed)
 FYI please see triage notes attached

## 2024-04-03 NOTE — Telephone Encounter (Signed)
 Chief Complaint: abdominal pain/1 fainting spell Symptoms: see notes Frequency: 1 week Pertinent Negatives: Patient denies back or chest pain, blood in stool, abnormal bleeding, constipation Disposition: [] ED /[] Urgent Care (no appt availability in office) / [x] Appointment(In office/virtual)/ []  [facility]/ [] Home Care/ [] Refused Recommended Disposition /[]  Mobile Bus/ []  Follow-up with PCP Additional Notes: Patient called in wanting to be seen in office today for abdominal pain that has been ongoing for 1 week, around her naval area. Patient had a fainting spell yesterday in the grocery store, stating she felt hot, dizzy, and knew she was about to faint so she sat down on the ground. She states she did actually faint but wasn't sure how long she was unconscious for. Patient did not seek emergency medical attention, but states someone took her BP and she was experiencing orthostatic hypotension. Patient states she had called last week to report symptoms of a UTI but was never seen for the UTI due to not being able to make it into the office. Patient denies back pain and chest pain, but states "I can't tell if I have pain in my chest, I do not feel good". This RN advised patient she likely needs to be evaluated at ED but patient refused and would like an in-person evaluation at the PCP office first. Patient seems stable at this time, but concerns for worsening infection/sepsis with hypotension and ongoing UTI. Patient educated of this and informed if she feels worse today, she needs to go to ED or call 911.    Copied from CRM [id number]. Topic: Clinical - Red Word Triage >> Apr 03, 2024  8:19 AM Lizabeth Riggs wrote: Red Word that prompted transfer to Nurse Triage:  She got dizzy and fainted in a store yesterday. She is having a lot of stomach pain, pain level 6 or 7. She did not get checked out anywhere. Reason for Disposition  [1] MILD-MODERATE pain AND [2] constant AND [3] present > 2  hours  Answer Assessment - Initial Assessment Questions 1. LOCATION: "Where does it hurt?"      Around belly button 2. RADIATION: "Does the pain shoot anywhere else?" (e.g., chest, back)     Unsure if she feels hcest 3. ONSET: "When did the pain begin?" (e.g., minutes, hours or days ago)      About a week 4. SUDDEN: "Gradual or sudden onset?"     Gradual  5. PATTERN "Does the pain come and go, or is it constant?"    - If it comes and goes: "How long does it last?" "Do you have pain now?"     (Note: Comes and goes means the pain is intermittent. It goes away completely between bouts.)    - If constant: "Is it getting better, staying the same, or getting worse?"      (Note: Constant means the pain never goes away completely; most serious pain is constant and gets worse.)      Comes and goes 6. SEVERITY: "How bad is the pain?"  (e.g., Scale 1-10; mild, moderate, or severe)    - MILD (1-3): Doesn't interfere with normal activities, abdomen soft and not tender to touch.     - MODERATE (4-7): Interferes with normal activities or awakens from sleep, abdomen tender to touch.     - SEVERE (8-10): Excruciating pain, doubled over, unable to do any normal activities.       Ranges 8. CAUSE: "What do you think is causing the stomach pain?"  Unsure  9. RELIEVING/AGGRAVATING FACTORS: "What makes it better or worse?" (e.g., antacids, bending or twisting motion, bowel movement)     None 10. OTHER SYMPTOMS: "Do you have any other symptoms?" (e.g., back pain, diarrhea, fever, urination pain, vomiting)       Orthostatic hypotension, UTI symptoms as a week ago  Protocols used: Abdominal Pain - Female-A-AH

## 2024-04-04 LAB — CBC WITH DIFFERENTIAL/PLATELET
Basophils Absolute: 0.1 10*3/uL (ref 0.0–0.2)
Basos: 1 %
EOS (ABSOLUTE): 0.2 10*3/uL (ref 0.0–0.4)
Eos: 2 %
Hematocrit: 38.6 % (ref 34.0–46.6)
Hemoglobin: 12.2 g/dL (ref 11.1–15.9)
Immature Grans (Abs): 0 10*3/uL (ref 0.0–0.1)
Immature Granulocytes: 0 %
Lymphocytes Absolute: 2.9 10*3/uL (ref 0.7–3.1)
Lymphs: 35 %
MCH: 27.5 pg (ref 26.6–33.0)
MCHC: 31.6 g/dL (ref 31.5–35.7)
MCV: 87 fL (ref 79–97)
Monocytes Absolute: 0.6 10*3/uL (ref 0.1–0.9)
Monocytes: 7 %
Neutrophils Absolute: 4.7 10*3/uL (ref 1.4–7.0)
Neutrophils: 55 %
Platelets: 343 10*3/uL (ref 150–450)
RBC: 4.43 x10E6/uL (ref 3.77–5.28)
RDW: 12.9 % (ref 11.7–15.4)
WBC: 8.4 10*3/uL (ref 3.4–10.8)

## 2024-04-04 LAB — BASIC METABOLIC PANEL WITH GFR
BUN/Creatinine Ratio: 19 (ref 9–23)
BUN: 22 mg/dL (ref 6–24)
CO2: 25 mmol/L (ref 20–29)
Calcium: 10.2 mg/dL (ref 8.7–10.2)
Chloride: 101 mmol/L (ref 96–106)
Creatinine, Ser: 1.18 mg/dL — ABNORMAL HIGH (ref 0.57–1.00)
Glucose: 101 mg/dL — ABNORMAL HIGH (ref 70–99)
Potassium: 4.4 mmol/L (ref 3.5–5.2)
Sodium: 143 mmol/L (ref 134–144)
eGFR: 55 mL/min/{1.73_m2} — ABNORMAL LOW

## 2024-04-04 LAB — URINALYSIS, MICROSCOPIC ONLY
Casts: NONE SEEN /LPF
RBC, Urine: NONE SEEN /HPF (ref 0–2)
WBC, UA: NONE SEEN /HPF (ref 0–5)

## 2024-04-04 LAB — HIV ANTIBODY (ROUTINE TESTING W REFLEX): HIV Screen 4th Generation wRfx: NONREACTIVE

## 2024-04-04 LAB — RPR: RPR Ser Ql: NONREACTIVE

## 2024-04-04 NOTE — Addendum Note (Signed)
 Addended by: Walther Sanagustin on: 04/04/2024 08:20 AM   Modules accepted: Orders

## 2024-04-05 LAB — URINE CULTURE

## 2024-04-07 LAB — CERVICOVAGINAL ANCILLARY ONLY
Bacterial Vaginitis (gardnerella): NEGATIVE
Candida Glabrata: NEGATIVE
Candida Vaginitis: NEGATIVE
Chlamydia: NEGATIVE
Comment: NEGATIVE
Comment: NEGATIVE
Comment: NEGATIVE
Comment: NEGATIVE
Comment: NEGATIVE
Comment: NORMAL
Neisseria Gonorrhea: NEGATIVE
Trichomonas: NEGATIVE

## 2024-04-10 ENCOUNTER — Ambulatory Visit: Payer: Self-pay | Admitting: Family Medicine

## 2024-05-01 ENCOUNTER — Other Ambulatory Visit: Payer: Self-pay | Admitting: Family Medicine

## 2024-05-05 ENCOUNTER — Telehealth: Payer: Self-pay

## 2024-05-05 DIAGNOSIS — E1140 Type 2 diabetes mellitus with diabetic neuropathy, unspecified: Secondary | ICD-10-CM

## 2024-05-05 NOTE — Telephone Encounter (Signed)
    Copied from CRM [id number]. Topic: General - Other >> May 05, 2024  8:53 AM Zenovia PARAS wrote: Reason for CRM: Requesting another sample of tirzepatide  (MOUNJARO ) 5 MG/0.5ML Pen. Would like a call to discuss

## 2024-05-05 NOTE — Telephone Encounter (Signed)
 Requesting pharmacy referral to assist with diabetes management, previously given the patient samples for mounjaro 

## 2024-05-05 NOTE — Telephone Encounter (Signed)
 Before I call the patient I wanted to ask you about this. It looks like her Mounjaro  has never been approved.  She has only gotten samples.  I don't think we can continue to just do samples.  Can we do a referral to pharmacy to see if they can get her help.

## 2024-05-05 NOTE — Telephone Encounter (Signed)
Patient advised of referral  

## 2024-05-08 ENCOUNTER — Other Ambulatory Visit (HOSPITAL_COMMUNITY): Payer: Self-pay

## 2024-05-23 ENCOUNTER — Other Ambulatory Visit: Payer: Self-pay

## 2024-05-23 ENCOUNTER — Other Ambulatory Visit (HOSPITAL_COMMUNITY): Payer: Self-pay

## 2024-05-23 ENCOUNTER — Telehealth: Payer: Self-pay

## 2024-05-23 NOTE — Progress Notes (Signed)
 Complex Care Management Note  Care Guide Note 05/23/2024 Name: Heidi Kline MRN: [id number] DOB: Aug 29, 1971  [name] is a 53 y.o. year old female who sees Simmons-Robinson, [place], MD for primary care. I reached out to [name] by phone today to offer complex care management services.  [name] was given information about Complex Care Management services today including:   The Complex Care Management services include support from the care team which includes your Nurse Care Manager, Clinical Social Worker, or Pharmacist.  The Complex Care Management team is here to help remove barriers to the health concerns and goals most important to you. Complex Care Management services are voluntary, and the patient may decline or stop services at any time by request to their care team member.   Complex Care Management Consent Status: Patient agreed to services and verbal consent obtained.   Follow up plan:  Telephone appointment with complex care management team member scheduled for:  05/23/24 at 3:00 p.m.   Encounter Outcome:  Patient Scheduled  Dreama [facility]  [facility], [facility] Health Care Management Assistant Direct Dial: [phone]  Fax: [phone]

## 2024-05-23 NOTE — Progress Notes (Addendum)
   05/23/2024 Name: Heidi Kline MRN: [id number] DOB: 1970/12/15  Chief Complaint  Patient presents with   Medication Assistance    Heidi Kline is a 53 y.o. year old female who presented for a telephone visit.   They were referred to the pharmacist by their PCP for assistance in managing medication assistance for Mounjaro .    Subjective:  Care Team: Primary Care Provider: Sharma Coyer, MD ; Next Scheduled Visit: 07/08/2024   Medication Access/Adherence  Current Pharmacy:  [id number] [address] [facility], [place] - [address] [place] [address] Phone: [phone] Fax: [phone]     Insurance and Medication Access Update - Mounjaro  Insurance: BC[employer] Careers adviser (through [employer] employer)  PA Status: A prior authorization is required. [facility] has attempted to re-submit the PA request to payor today, but "no other medications have been tried per insurance requirements" was the information the pharmacy provided.  The patient has been out of Mounjaro  for approximately 3 weeks. She contacted the PCP for samples and was told that a referral would be sent to me. She is not sure if her PCP has Mounjaro  samples available.   The patient reports that she has historically reduced the dispensed quantity of Mounjaro  due to cost but denies pill splitting. She did not experience cost issues when previously covered under BCBS of  . Her fill history indicates she had been receiving 90-day supplies billed through insurance.  While she does not want to switch to a 30-day supply, she reports that the pharmacy has worked with her on partial fills to help with payment, based on her understanding.  Per chart review (Media Tab, 01/31/2024): Insurance requires a trial of metformin or insulin before approving Mounjaro . Her PCP previously attempted to appeal but was denied. The patient reports she was unable to tolerate Ozempic due  to severe side effects. She does not want to try metformin due to a history of GI intolerance while on medication.  Blood Glucose (BG): Today's BG: 83-84 mg/dL (per patient) Highest BG in the past week: 120 mg/dL  Contact information for prior authorization: [phone]     Objective:  Lab Results  Component Value Date   HGBA1C 6.7 (H) 01/24/2024    Lab Results  Component Value Date   CREATININE 1.18 (H) 04/03/2024   BUN 22 04/03/2024   NA 143 04/03/2024   K 4.4 04/03/2024   CL 101 04/03/2024   CO2 25 04/03/2024    Lab Results  Component Value Date   CHOL 211 (H) 07/16/2023   HDL 60 07/16/2023   LDLCALC 137 (H) 07/16/2023   TRIG 79 07/16/2023   CHOLHDL 3.5 07/16/2023    Medications Reviewed Today   Medications were not reviewed in this encounter       Assessment/Plan:  - Patients diabetes remains well controlled without any medication at this time. However, to sustain patient being below goal, will continue to try to help patient with needs.   - Will contact PCP for Mounjaro  samples and re-submit prior authorization appeal to the pharmacy team.   - Other medications are not due for refill until mid-August. Will follow up on whether additional assistance for cost of medications.   Follow Up Plan: 3-5 business days  Dorcas Solian, PharmD Clinical Pharmacist Cell: [phone]

## 2024-05-25 ENCOUNTER — Other Ambulatory Visit: Payer: Self-pay | Admitting: Family Medicine

## 2024-05-26 NOTE — Telephone Encounter (Signed)
 LOV 04/03/24 NOV 07/08/24 LRF 05/02/24 LABS 04/03/24

## 2024-05-28 NOTE — Progress Notes (Signed)
   05/28/2024  Patient ID: [name], female   DOB: 1971-08-19, 53 y.o.   MRN: [id number]  Per chart review, I do not see any updates for Mounjaro  prior authorization. Will reach out to prior authorization team today for an update. Will also reach out again to PCP's office staff for Mounjaro  sample if allowed.   Dorcas Solian, PharmD Clinical Pharmacist Cell: [phone]

## 2024-06-04 ENCOUNTER — Other Ambulatory Visit (HOSPITAL_COMMUNITY): Payer: Self-pay

## 2024-06-04 NOTE — Progress Notes (Signed)
   06/04/2024  Patient ID: Heidi Kline Shelter, female   DOB: [birth year], 53 y.o.   MRN: [id number]  I contacted office staff and was informed there are no mounjaro  samples available. Still awaiting prior authorization team's response.   Dorcas Solian, PharmD Clinical Pharmacist Cell: [phone]

## 2024-06-05 ENCOUNTER — Other Ambulatory Visit (HOSPITAL_COMMUNITY): Payer: Self-pay

## 2024-06-06 ENCOUNTER — Other Ambulatory Visit (HOSPITAL_COMMUNITY): Payer: Self-pay

## 2024-06-06 ENCOUNTER — Telehealth: Payer: Self-pay | Admitting: Pharmacist

## 2024-06-06 NOTE — Telephone Encounter (Signed)
 Appeal has been submitted for Mounjaro . Will advise when response is received, please be advised that most companies may take 30 days to make a decision. Appeal letter and supporting documentation including metformin intolerance has been faxed to insurance at [phone] on 06/06/2024 @9 :29 am.  Thank you, Devere Pandy, PharmD Clinical Pharmacist  [facility]  Direct Dial: [phone]

## 2024-06-09 ENCOUNTER — Other Ambulatory Visit (HOSPITAL_COMMUNITY): Payer: Self-pay

## 2024-06-09 NOTE — Telephone Encounter (Signed)
 Insurance has denied the appeal for Mounjaro  stating the following reason:   Appeal letter did include the documentation of metformin intolerance.

## 2024-06-13 ENCOUNTER — Ambulatory Visit
Admission: EM | Admit: 2024-06-13 | Discharge: 2024-06-13 | Disposition: A | Discharge status: Home / Self Care | Attending: Internal Medicine | Admitting: Internal Medicine

## 2024-06-13 ENCOUNTER — Telehealth: Payer: Self-pay

## 2024-06-13 DIAGNOSIS — R350 Frequency of micturition: Secondary | ICD-10-CM | POA: Diagnosis not present

## 2024-06-13 DIAGNOSIS — R8271 Bacteriuria: Secondary | ICD-10-CM | POA: Diagnosis not present

## 2024-06-13 DIAGNOSIS — R822 Biliuria: Secondary | ICD-10-CM | POA: Diagnosis not present

## 2024-06-13 DIAGNOSIS — Z113 Encounter for screening for infections with a predominantly sexual mode of transmission: Secondary | ICD-10-CM | POA: Diagnosis not present

## 2024-06-13 LAB — URINALYSIS, W/ REFLEX TO CULTURE (INFECTION SUSPECTED)
Glucose, UA: NEGATIVE mg/dL
Hgb urine dipstick: NEGATIVE
Ketones, ur: NEGATIVE mg/dL
Leukocytes,Ua: NEGATIVE
Nitrite: NEGATIVE
Protein, ur: NEGATIVE mg/dL
Specific Gravity, Urine: 1.025 (ref 1.005–1.030)
pH: 5.5 (ref 5.0–8.0)

## 2024-06-13 NOTE — Telephone Encounter (Signed)
 Copied from CRM [id number]. Topic: Appointments - Scheduling Inquiry for Clinic >> Jun 13, 2024 12:44 PM [name] wrote: Reason for CRM: Patient is calling she just left urgent care and was told need a appt asap for lliver and kidney function she is going to need lab work done, she is concerned about her bilirubin.Heidi Kline

## 2024-06-13 NOTE — Discharge Instructions (Signed)
 Your urine tests shows a chemical called Bilirubin and you need to follow up with your primary care doctor to have blood work done to check your liver and kidney function. It does not have signs of typical bladder infection. Due to having some bacteria, I will order a urine culture and we will call you if the results are positive, as well as for the STD tests.

## 2024-06-13 NOTE — ED Provider Notes (Signed)
 [facility]    CSN: [id number] Arrival date & time: 06/13/24  1133      History   Chief Complaint Chief Complaint  Patient presents with   Urinary Tract Infection    HPI Heidi Kline is a 53 y.o. female who presents with urinary urgency x 4 days and wants to check for UTI. She admits she drinks a lot of [employer] tea. She would also like STD testing since she has a new sexual partner, but denies abnormal vaginal discharge. Denies abdominal pain.   She declined blood work since she had a recent negative HIV   Past Medical History:  Diagnosis Date   Hypertension    Neuropathy     Patient Active Problem List   Diagnosis Date Noted   Arterial hypotension 04/03/2024   Other constipation 04/03/2024   Snoring 01/24/2024   Type 2 diabetes mellitus with diabetic neuropathy, without long-term current use of insulin (HCC) 12/13/2023   Sprain of left ankle 09/19/2023   HLD (hyperlipidemia) 07/17/2023   Annual physical exam 07/03/2023   Sciatica of right side 05/03/2023   HSV (herpes simplex virus) anogenital infection 03/29/2023   Morbid (severe) obesity due to excess calories (HCC) 01/18/2023   GERD (gastroesophageal reflux disease) 06/16/2019   Urge incontinence 06/16/2019   Depression 08/13/2018   Insomnia 08/13/2018   Vitamin D  deficiency 08/13/2018   Plantar fasciitis 04/04/2015   Hypertension 12/04/2013   Neuropathy of hand 12/04/2013    Past Surgical History:  Procedure Laterality Date   ABDOMINAL HYSTERECTOMY     skin grafts      OB History   No obstetric history on file.      Home Medications    Prior to Admission medications   Medication Sig Start Date End Date Taking? Authorizing Provider  acyclovir  (ZOVIRAX ) 400 MG tablet TAKE 1 TABLET BY MOUTH TWICE A DAY 04/01/24  Yes Bacigalupo, Angela M, MD  amLODipine  (NORVASC ) 5 MG tablet Take 1 tablet (5 mg total) by mouth daily. 09/19/23  Yes Simmons-Robinson, Makiera, MD  celecoxib  (CELEBREX )  200 MG capsule Take 1 capsule (200 mg total) by mouth 2 (two) times daily. 12/13/23  Yes Simmons-Robinson, Makiera, MD  etodolac  (LODINE ) 200 MG capsule TAKE 1 CAPSULE (200 MG TOTAL) BY MOUTH EVERY 8 HOURS 05/28/24  Yes Simmons-Robinson, Makiera, MD  famciclovir  (FAMVIR ) 250 MG tablet Take 1 tablet (250 mg total) by mouth 2 (two) times daily. 09/03/23  Yes Simmons-Robinson, Makiera, MD  lisinopril -hydrochlorothiazide  (ZESTORETIC ) 20-12.5 MG tablet Take 1 tablet by mouth daily. 09/19/23  Yes Simmons-Robinson, Makiera, MD  tirzepatide  (MOUNJARO ) 5 MG/0.5ML Pen Inject 5 mg into the skin once a week. 12/13/23  Yes Simmons-Robinson, Makiera, MD  tiZANidine  (ZANAFLEX ) 4 MG tablet Take 1 tablet (4 mg total) by mouth every 6 (six) hours as needed for muscle spasms. 09/21/23  Yes Simmons-Robinson, Makiera, MD  carbamazepine (TEGRETOL) 200 MG tablet  02/16/15 12/16/19  [provider]  fluticasone  (FLONASE ) 50 MCG/ACT nasal spray Place 2 sprays into both nostrils daily. 12/30/15 12/16/19  Stephania Ozell RAMAN, MD    Family History Family History  Problem Relation Age of Onset   Diabetes Mother    Cancer Mother    Diabetes Father    Cancer Father    Breast cancer Maternal Aunt    Breast cancer Paternal Aunt     Social History Social History   Tobacco Use   Smoking status: Never   Smokeless tobacco: Never  Vaping Use   Vaping status:  Never Used  Substance Use Topics   Alcohol use: No    Alcohol/week: 0.0 standard drinks of alcohol   Drug use: Never     Allergies   Patient has no known allergies.   Review of Systems Review of Systems As noted in HPI  Physical Exam Triage Vital Signs ED Triage Vitals  Encounter Vitals Group     BP 06/13/24 1149 138/80     Girls Systolic BP Percentile --      Girls Diastolic BP Percentile --      Boys Systolic BP Percentile --      Boys Diastolic BP Percentile --      Pulse Rate 06/13/24 1149 73     Resp --      Temp 06/13/24 1149 97.8 F (36.6  C)     Temp Source 06/13/24 1149 Oral     SpO2 06/13/24 1149 100 %     Weight 06/13/24 1152 253 lb (114.8 kg)     Height 06/13/24 1152 5' 4 (1.626 m)     Head Circumference --      Peak Flow --      Pain Score 06/13/24 1151 3     Pain Loc --      Pain Education --      Exclude from Growth Chart --    No data found.  Updated Vital Signs BP 138/80 (BP Location: Left Arm)   Pulse 73   Temp 97.8 F (36.6 C) (Oral)   Ht 5' 4 (1.626 m)   Wt 253 lb (114.8 kg)   SpO2 100%   BMI 43.43 kg/m   Visual Acuity Right Eye Distance:   Left Eye Distance:   Bilateral Distance:    Right Eye Near:   Left Eye Near:    Bilateral Near:     Physical Exam Vitals reviewed.  Constitutional:      General: She is not in acute distress.    Appearance: She is obese. She is not ill-appearing or toxic-appearing.  Eyes:     General: No scleral icterus.    Conjunctiva/sclera: Conjunctivae normal.  Pulmonary:     Effort: Pulmonary effort is normal.  Musculoskeletal:        General: Normal range of motion.     Cervical back: Neck supple.  Neurological:     Mental Status: She is alert and oriented to person, place, and time.     Gait: Gait normal.  Psychiatric:        Mood and Affect: Mood normal.        Behavior: Behavior normal.        Thought Content: Thought content normal.        Judgment: Judgment normal.      UC Treatments / Results  Labs (all labs ordered are listed, but only abnormal results are displayed) Labs Reviewed  URINALYSIS, W/ REFLEX TO CULTURE (INFECTION SUSPECTED) - Abnormal; Notable for the following components:      Result Value   Bilirubin Urine LARGE (*)    Bacteria, UA FEW (*)    All other components within normal limits  URINE CULTURE  CERVICOVAGINAL ANCILLARY ONLY    EKG   Radiology No results found.  Procedures Procedures (including critical care time)  Medications Ordered in UC Medications - No data to display  Initial Impression /  Assessment and Plan / UC Course  I have reviewed the triage vital signs and the nursing notes.  Pertinent labs  results that were available  during my care of the patient were reviewed by me and considered in my medical decision making (see chart for details).  Urinary frequency Bacteria in urine Bilirubinuria  Urine was sent out for a culture. Advised to FU with PCP about having blood work due to [employer] in urine. We will call her if urine culture and STD test come back positive.    Final Clinical Impressions(s) / UC Diagnoses   Final diagnoses:  Bacteria in urine  Bilirubinuria  Urinary frequency  Screen for STD (sexually transmitted disease)     Discharge Instructions      Your urine tests shows a chemical called Bilirubin and you need to follow up with your primary care doctor to have blood work done to check your liver and kidney function. It does not have signs of typical bladder infection. Due to having some bacteria, I will order a urine culture and we will call you if the results are positive, as well as for the STD tests.      ED Prescriptions   None    PDMP not reviewed this encounter.   Lindi Carter, PA-C 06/13/24 1251

## 2024-06-13 NOTE — ED Triage Notes (Signed)
 Pt c/o possible UTI x4days  Pt states she is having urinary urgency and drinks a lot of [employer] Tea  Pt asks for STD testing and recently got a new sexual partner  Pt denies vaginal discharge or odor.

## 2024-06-14 LAB — URINE CULTURE: Special Requests: NORMAL

## 2024-06-16 ENCOUNTER — Ambulatory Visit (HOSPITAL_COMMUNITY): Payer: Self-pay

## 2024-06-16 ENCOUNTER — Ambulatory Visit: Payer: Self-pay

## 2024-06-16 LAB — CERVICOVAGINAL ANCILLARY ONLY
Bacterial Vaginitis (gardnerella): NEGATIVE
Candida Glabrata: NEGATIVE
Candida Vaginitis: NEGATIVE
Chlamydia: NEGATIVE
Comment: NEGATIVE
Comment: NEGATIVE
Comment: NEGATIVE
Comment: NEGATIVE
Comment: NEGATIVE
Comment: NORMAL
Neisseria Gonorrhea: NEGATIVE
Trichomonas: NEGATIVE

## 2024-06-16 NOTE — Telephone Encounter (Signed)
 FYI Only or Action Required?: Action required by provider: request for appointment.  Patient was last seen in primary care on 01/24/2024 by Sharma Coyer, MD.  Called Nurse Triage reporting Migraine.  Symptoms began a week ago.  Interventions attempted: Nothing.  Symptoms are: unchanged. Having migraines and concerned about recent lab work that showed elevated liver function and elevated bilirubin.  Triage Disposition: See Physician Within 24 Hours  Patient/caregiver understands and will follow disposition?: Yes   Copied from CRM #[id number]. Topic: Clinical - Red Word Triage >> Jun 16, 2024 12:05 PM Ivette P wrote: [facility] that prompted transfer to Nurse Triage:  Liver and kidney funcitons were high   meet with another physician   needing another    migraine headaches and not feeling herslef. Urine need to be retested   your urine checmial [place] - need ot follow up with PCP and have blood work. was on moujaro and stopped taking.    Infection and wants to know next step Reason for Disposition  [1] MODERATE headache (e.g., interferes with normal activities) AND [2] present > 24 hours AND [3] unexplained  (Exceptions: Pain medicines not tried, typical migraine, or headache part of viral illness.)  Answer Assessment - Initial Assessment Questions 1. LOCATION: Where does it hurt?      All over 2. ONSET: When did the headache start? (e.g., minutes, hours, days)      Last week 3. PATTERN: Does the pain come and go, or has it been constant since it started?     Comes and goes 4. SEVERITY: How bad is the pain? and What does it keep you from doing?  (e.g., Scale 1-10; mild, moderate, or severe)     mild 5. RECURRENT SYMPTOM: Have you ever had headaches before? If Yes, ask: When was the last time? and What happened that time?      yes 6. CAUSE: What do you think is causing the headache?     unsure 7. MIGRAINE: Have you been diagnosed with  migraine headaches? If Yes, ask: Is this headache similar?      yes 8. HEAD INJURY: Has there been any recent injury to your head?      no 9. OTHER SYMPTOMS: Do you have any other symptoms? (e.g., fever, stiff neck, eye pain, sore throat, cold symptoms)     no 10. PREGNANCY: Is there any chance you are pregnant? When was your last menstrual period?       no  Protocols used: Headache-A-AH

## 2024-06-17 ENCOUNTER — Ambulatory Visit: Admitting: Family Medicine

## 2024-06-17 VITALS — BP 139/77 | HR 69 | Resp 16 | Ht 64.0 in | Wt 253.0 lb

## 2024-06-17 DIAGNOSIS — F4321 Adjustment disorder with depressed mood: Secondary | ICD-10-CM

## 2024-06-17 DIAGNOSIS — G43009 Migraine without aura, not intractable, without status migrainosus: Secondary | ICD-10-CM | POA: Diagnosis not present

## 2024-06-17 DIAGNOSIS — R300 Dysuria: Secondary | ICD-10-CM

## 2024-06-17 DIAGNOSIS — F411 Generalized anxiety disorder: Secondary | ICD-10-CM

## 2024-06-17 DIAGNOSIS — R350 Frequency of micturition: Secondary | ICD-10-CM

## 2024-06-17 DIAGNOSIS — R748 Abnormal levels of other serum enzymes: Secondary | ICD-10-CM

## 2024-06-17 DIAGNOSIS — G479 Sleep disorder, unspecified: Secondary | ICD-10-CM

## 2024-06-17 DIAGNOSIS — F430 Acute stress reaction: Secondary | ICD-10-CM

## 2024-06-17 LAB — POCT URINALYSIS DIPSTICK
Blood, UA: NEGATIVE
Glucose, UA: NEGATIVE
Ketones, UA: NEGATIVE
Leukocytes, UA: NEGATIVE
Nitrite, UA: NEGATIVE
Protein, UA: NEGATIVE
Spec Grav, UA: 1.015 (ref 1.010–1.025)
Urobilinogen, UA: 0.2 U/dL
pH, UA: 5 (ref 5.0–8.0)

## 2024-06-17 MED ORDER — SUMATRIPTAN SUCCINATE 25 MG PO TABS: 25.0000 mg | ORAL_TABLET | ORAL | 0 refills | Status: AC | PRN

## 2024-06-17 MED ORDER — CEPHALEXIN 500 MG PO CAPS: 500.0000 mg | ORAL_CAPSULE | Freq: Three times a day (TID) | ORAL | 0 refills | Status: AC

## 2024-06-17 MED ORDER — ALPRAZOLAM 0.5 MG PO TABS: 0.5000 mg | ORAL_TABLET | Freq: Two times a day (BID) | ORAL | 0 refills | Status: AC | PRN

## 2024-06-17 NOTE — Progress Notes (Signed)
 ACUTE VISIT   Patient: Heidi Kline   DOB: December 29, 1970   53 y.o. Female  MRN: [id number]   PCP: Sharma Coyer, MD  Chief Complaint  Patient presents with   Migraine    F/U Migraines Pt states she feels like her UTI  is still present. Despite going to UC and all was neg.. Pt wants labs and anxiety   Subjective    Migraine    HPI     Migraine    Additional comments: F/U Migraines Pt states she feels like her UTI  is still present. Despite going to UC and all was neg.. Pt wants labs and anxiety      Last edited by Marylen Odella CROME, CMA on 06/17/2024  1:36 PM.       Discussed the use of AI scribe software for clinical note transcription with the patient, who gave verbal consent to proceed.  History of Present Illness Heidi Kline is a 53 year old female with migraines and type 2 diabetes who presents with migraine headaches and urinary symptoms.  She reports severe migraine headaches characterized by intense pressure, worsened by bright lights, and denies associated nausea or visual disturbances. These headaches have been particularly debilitating, causing her to leave work early on two consecutive days last week. She has a history of using sumatriptan  for migraines, which was effective in the past.  She reports ongoing urinary symptoms, feeling like she has a urinary tract infection despite negative test results from urgent care. She reports frequent urination but denies pain, odor, or discharge.  She is experiencing significant stress and anxiety, exacerbated by recent family bereavements and caregiving responsibilities for her disabled nephews. She reports waking up at 3 AM every day for the past month and being unable to return to sleep. She has been out of work for two weeks due to these stressors and feels overwhelmed by her responsibilities.  She has a history of type 2 diabetes, currently managed with Mounjaro  5 mg, and hypertension  managed with lisinopril  20 mg and hydrochlorothiazide  12.5 mg. She has not taken Mounjaro  for a month due to insurance issues, and she has lost weight, now weighing 250 pounds. She questions whether the lack of Mounjaro  could be affecting her liver enzymes or kidney function.     Medications: Outpatient Medications Prior to Visit  Medication Sig   acyclovir  (ZOVIRAX ) 400 MG tablet TAKE 1 TABLET BY MOUTH TWICE A DAY   amLODipine  (NORVASC ) 5 MG tablet Take 1 tablet (5 mg total) by mouth daily.   etodolac  (LODINE ) 200 MG capsule TAKE 1 CAPSULE (200 MG TOTAL) BY MOUTH EVERY 8 HOURS   famciclovir  (FAMVIR ) 250 MG tablet Take 1 tablet (250 mg total) by mouth 2 (two) times daily.   lisinopril -hydrochlorothiazide  (ZESTORETIC ) 20-12.5 MG tablet Take 1 tablet by mouth daily.   tirzepatide  (MOUNJARO ) 5 MG/0.5ML Pen Inject 5 mg into the skin once a week.   tiZANidine  (ZANAFLEX ) 4 MG tablet Take 1 tablet (4 mg total) by mouth every 6 (six) hours as needed for muscle spasms.   [DISCONTINUED] celecoxib  (CELEBREX ) 200 MG capsule Take 1 capsule (200 mg total) by mouth 2 (two) times daily.   No facility-administered medications prior to visit.    Last metabolic panel Lab Results  Component Value Date   GLUCOSE 101 (H) 04/03/2024   NA 143 04/03/2024   K 4.4 04/03/2024   CL 101 04/03/2024   CO2 25 04/03/2024   BUN 22  04/03/2024   CREATININE 1.18 (H) 04/03/2024   EGFR 55 (L) 04/03/2024   CALCIUM 10.2 04/03/2024   PROT 6.4 12/13/2023   ALBUMIN 4.1 12/13/2023   LABGLOB 2.3 12/13/2023   AGRATIO 2.0 03/29/2023   BILITOT <0.2 12/13/2023   ALKPHOS 109 12/13/2023   AST 48 (H) 12/13/2023   ALT 76 (H) 12/13/2023   Last lipids Lab Results  Component Value Date   CHOL 211 (H) 07/16/2023   HDL 60 07/16/2023   LDLCALC 137 (H) 07/16/2023   TRIG 79 07/16/2023   CHOLHDL 3.5 07/16/2023   Last hemoglobin A1c Lab Results  Component Value Date   HGBA1C 6.7 (H) 01/24/2024   Last thyroid  functions Lab  Results  Component Value Date   TSH 0.989 01/24/2024     {See past labs  Heme  Chem  Endocrine  Serology  Results Review (optional):1}   Objective    BP 139/77 (BP Location: Right Arm, Patient Position: Sitting, Cuff Size: Normal)   Pulse 69   Resp 16   Ht 5' 4 (1.626 m)   Wt 253 lb (114.8 kg)   SpO2 100%   BMI 43.43 kg/m  BP Readings from Last 3 Encounters:  06/17/24 139/77  04/03/24 (!) 82/66  01/24/24 114/73   Wt Readings from Last 3 Encounters:  06/17/24 253 lb (114.8 kg)  04/03/24 264 lb (119.7 kg)  01/24/24 264 lb (119.7 kg)    {See vitals history (optional):1}   Physical Exam MEASUREMENTS: Weight- 250-253. ABDOMEN: Mild suprapubic tenderness, no costovertebral angle tenderness bilaterally, no masses palpated. PSYCH: tearful, depressed, denies SI   No results found for any visits on 06/17/24.  Assessment & Plan      Assessment & Plan Migraine Recurrent severe migraines causing significant discomfort, exacerbated by stress and lack of sleep. No associated nausea or visual disturbances. Previous use of sumatriptan  was effective. - Prescribe Imitrex  25 mg, take one tablet at onset of migraine, may repeat in 2 hours if needed, not to exceed two doses in 24 hours.  Urinary symptoms with possible urinary tract infection Persistent urinary frequency without dysuria, hematuria, or significant pain. Previous urine cultures showed multiple bacteria but no definitive UTI. Possible bladder irritation or mild infection. Bilirubin noted in urine, but negative for nitrites, blood, and white blood cells. - Order urinalysis and urine culture. - Prescribe Keflex  500 mg three times daily for 7 days. - Order comprehensive metabolic panel to assess liver enzymes and kidney function.  Anxiety and acute stress reaction with insomnia Significant anxiety and acute stress reaction due to recent family stressors and bereavement. Insomnia with early morning awakenings. Previous  use of trazodone was ineffective. Discussed short-term use of alprazolam  due to potential for dependency. - Prescribe alprazolam  0.5 mg, take as needed for anxiety, up to twice daily. - Plan follow-up in 2-3 weeks to reassess anxiety and sleep.  Type 2 diabetes mellitus with diabetic neuropathy Type 2 diabetes managed with Mounjaro . Recent weight loss noted. Concerns about potential liver enzyme elevation due to medication. - Order comprehensive metabolic panel to assess liver enzymes and kidney function.  Hypertension Chronic  Hypertension managed with lisinopril  and hydrochlorothiazide . - continue current medications      Return in about 2 weeks (around 07/01/2024) for migraine, anxiety and grief .        Rockie Agent, MD  [facility] [phone] (phone) [phone] (fax)  [facility]

## 2024-06-18 LAB — CMP14+EGFR
ALT: 31 IU/L (ref 0–32)
AST: 32 IU/L (ref 0–40)
Albumin: 4.6 g/dL (ref 3.8–4.9)
Alkaline Phosphatase: 133 IU/L — ABNORMAL HIGH (ref 44–121)
BUN/Creatinine Ratio: 20 (ref 9–23)
BUN: 19 mg/dL (ref 6–24)
Bilirubin Total: 0.5 mg/dL (ref 0.0–1.2)
CO2: 22 mmol/L (ref 20–29)
Calcium: 10 mg/dL (ref 8.7–10.2)
Chloride: 103 mmol/L (ref 96–106)
Creatinine, Ser: 0.96 mg/dL (ref 0.57–1.00)
Globulin, Total: 2.5 g/dL (ref 1.5–4.5)
Glucose: 100 mg/dL — ABNORMAL HIGH (ref 70–99)
Potassium: 4.2 mmol/L (ref 3.5–5.2)
Sodium: 143 mmol/L (ref 134–144)
Total Protein: 7.1 g/dL (ref 6.0–8.5)
eGFR: 70 mL/min/1.73 (ref >59)

## 2024-06-19 LAB — SPECIMEN STATUS REPORT

## 2024-06-19 LAB — URINE CULTURE

## 2024-06-20 ENCOUNTER — Ambulatory Visit: Payer: Self-pay | Admitting: Family Medicine

## 2024-06-23 MED ORDER — FLUCONAZOLE 150 MG PO TABS
ORAL_TABLET | ORAL | 0 refills | Status: DC
Start: 2024-06-23 — End: 2024-07-26

## 2024-06-25 ENCOUNTER — Telehealth: Payer: Self-pay

## 2024-06-25 NOTE — Telephone Encounter (Signed)
 Brief Telephone Documentation Reason for Call: Mounjaro  appeal  Summary of Call: Called patient regarding affordability of Mounjaro . Insurance appeal for medication was denied stating that patient must receive at least 90 days of another medication (e.g. metformin, SGLT2i, sulfonylurea) with inability to obtain an A1c <7% or contraindication to one of those medications, which was already reported to insurance without success.   Per patient, she has been off of Mounjaro  for ~2 months.   Discussed possibly trying another option for management of DM, such as Farxiga, but patient is not interested in any new medication options.   Follow Up: Will follow up after upcoming PCP appointment  Peyton Heidi Kline, PharmD Clinical Pharmacist [facility] [phone]

## 2024-07-08 ENCOUNTER — Ambulatory Visit (INDEPENDENT_AMBULATORY_CARE_PROVIDER_SITE_OTHER): Admitting: Family Medicine

## 2024-07-08 ENCOUNTER — Encounter: Payer: Self-pay | Admitting: Family Medicine

## 2024-07-08 VITALS — BP 126/64 | HR 76 | Wt 249.2 lb

## 2024-07-08 DIAGNOSIS — L659 Nonscarring hair loss, unspecified: Secondary | ICD-10-CM | POA: Diagnosis not present

## 2024-07-08 DIAGNOSIS — E1140 Type 2 diabetes mellitus with diabetic neuropathy, unspecified: Secondary | ICD-10-CM

## 2024-07-08 DIAGNOSIS — Z7984 Long term (current) use of oral hypoglycemic drugs: Secondary | ICD-10-CM

## 2024-07-08 DIAGNOSIS — R748 Abnormal levels of other serum enzymes: Secondary | ICD-10-CM

## 2024-07-08 DIAGNOSIS — G43009 Migraine without aura, not intractable, without status migrainosus: Secondary | ICD-10-CM | POA: Diagnosis not present

## 2024-07-08 DIAGNOSIS — E66813 Obesity, class 3: Secondary | ICD-10-CM | POA: Diagnosis not present

## 2024-07-08 MED ORDER — GLIPIZIDE 5 MG PO TABS: 5.0000 mg | ORAL_TABLET | Freq: Two times a day (BID) | ORAL | 3 refills | Status: AC

## 2024-07-08 MED ORDER — MINOXIDIL 2.5 MG PO TABS: 2.5000 mg | ORAL_TABLET | Freq: Every day | ORAL | 1 refills | Status: DC

## 2024-07-08 NOTE — Progress Notes (Unsigned)
 Established patient visit   Patient: Heidi Kline   DOB: 09/29/1971   53 y.o. Female  MRN: [id number] Visit Date: 07/08/2024  Today's healthcare provider: Rockie Agent, MD   Chief Complaint  Patient presents with   Follow-up   Subjective       Discussed the use of AI scribe software for clinical note transcription with the patient, who gave verbal consent to proceed.  History of Present Illness Heidi Kline is a 53 year old female who presents with concerns about hair loss.  She is experiencing significant hair loss, describing it as 'coming out' and 'shedding.' She has cut her hair short and applied temporary color to cover the patches. She is uncertain if the hair loss is related to any medication, mentioning past use of blood pressure medication. A dermatologist provided a different shampoo, but the cause of her hair loss remains unclear.  She reports a history of migraines and states that she has only needed to take Imitrex  once since her last visit, which effectively relieved her symptoms. She has only needed to take it once since her last visit, which effectively relieved her symptoms.  She experiences high levels of anxiety, which she attributes to stress from work and personal life. She takes Xanax  0.5 mg in the morning to manage her anxiety, particularly at work, and finds it helpful. She lives alone and manages her stress without involving her children.  She has a history of diabetes and has previously tried metformin, which caused diarrhea. She has also tried Ozempic and Mounjaro  for weight management. Her weight has decreased from 264 lbs in May to 249 lbs currently. She is not a stress eater and maintains her weight despite not eating much.  She mentions a family history of health issues, including her sister being diagnosed with H. pylori and experiencing similar symptoms. She also notes a history of fibroids and a past hysterectomy,  but still experiences some pain in the lower abdomen. Patient states that she took ozempic with metformin previously and had diarrhea so she does not want to take metformin again due to GI intolerance. She reports taking this over a year ago.     Past Medical History:  Diagnosis Date   Hypertension    Neuropathy     Medications: Outpatient Medications Prior to Visit  Medication Sig   acyclovir  (ZOVIRAX ) 400 MG tablet TAKE 1 TABLET BY MOUTH TWICE A DAY   ALPRAZolam  (XANAX ) 0.5 MG tablet Take 1 tablet (0.5 mg total) by mouth 2 (two) times daily as needed for anxiety.   amLODipine  (NORVASC ) 5 MG tablet Take 1 tablet (5 mg total) by mouth daily.   etodolac  (LODINE ) 200 MG capsule TAKE 1 CAPSULE (200 MG TOTAL) BY MOUTH EVERY 8 HOURS   famciclovir  (FAMVIR ) 250 MG tablet Take 1 tablet (250 mg total) by mouth 2 (two) times daily.   fluconazole  (DIFLUCAN ) 150 MG tablet Take one 150 mg tablet by mouth at onset of symptoms and repeat after 72 hours if symptoms persist   lisinopril -hydrochlorothiazide  (ZESTORETIC ) 20-12.5 MG tablet Take 1 tablet by mouth daily.   SUMAtriptan  (IMITREX ) 25 MG tablet Take 1 tablet (25 mg total) by mouth every 2 (two) hours as needed for migraine. May repeat in 2 hours if headache persists or recurs.   tirzepatide  (MOUNJARO ) 5 MG/0.5ML Pen Inject 5 mg into the skin once a week.   tiZANidine  (ZANAFLEX ) 4 MG tablet Take 1 tablet (4 mg total) by  mouth every 6 (six) hours as needed for muscle spasms.   No facility-administered medications prior to visit.    Review of Systems  Last CBC Lab Results  Component Value Date   WBC 8.4 04/03/2024   HGB 12.2 04/03/2024   HCT 38.6 04/03/2024   MCV 87 04/03/2024   MCH 27.5 04/03/2024   RDW 12.9 04/03/2024   PLT 343 04/03/2024   Last metabolic panel Lab Results  Component Value Date   GLUCOSE 92 07/08/2024   NA 141 07/08/2024   K 4.1 07/08/2024   CL 101 07/08/2024   CO2 24 07/08/2024   BUN 23 07/08/2024    CREATININE 1.10 (H) 07/08/2024   EGFR 60 07/08/2024   CALCIUM 10.0 07/08/2024   PROT 7.1 07/08/2024   ALBUMIN 4.6 07/08/2024   LABGLOB 2.5 07/08/2024   AGRATIO 2.0 03/29/2023   BILITOT 0.5 07/08/2024   ALKPHOS 145 (H) 07/08/2024   AST 28 07/08/2024   ALT 31 07/08/2024   Last lipids Lab Results  Component Value Date   CHOL 211 (H) 07/16/2023   HDL 60 07/16/2023   LDLCALC 137 (H) 07/16/2023   TRIG 79 07/16/2023   CHOLHDL 3.5 07/16/2023   The 10-year ASCVD risk score (Arnett DK, et al., 2019) is: 9.7%  Last hemoglobin A1c Lab Results  Component Value Date   HGBA1C 6.1 (H) 07/08/2024   Last thyroid  functions Lab Results  Component Value Date   TSH 0.989 01/24/2024   Last vitamin D  Lab Results  Component Value Date   VD25OH 30.5 01/24/2024   Last vitamin B12 and Folate Lab Results  Component Value Date   VITAMINB12 490 01/24/2024        07/08/2024    3:06 PM 06/17/2024    1:53 PM 04/03/2024    4:17 PM 01/24/2024   11:13 AM  GAD 7 : Generalized Anxiety Score  Nervous, Anxious, on Edge 0 2 0 0  Control/stop worrying 0 2 0 0  Worry too much - different things 0 2 2 0  Trouble relaxing 0 2 0 0  Restless 0  0 0  Easily annoyed or irritable 0 1 0 0  Afraid - awful might happen 0 0 0 0  Total GAD 7 Score 0  2 0  Anxiety Difficulty Not difficult at all Somewhat difficult Not difficult at all Not difficult at all   [facility] Office Visit from 07/08/2024 in [facility]  PHQ-9 Total Score 0        Objective    BP 126/64 (BP Location: Right Arm, Patient Position: Sitting, Cuff Size: Large)   Pulse 76   Wt 249 lb 3.2 oz (113 kg)   SpO2 96%   BMI 42.78 kg/m   BP Readings from Last 3 Encounters:  07/08/24 126/64  06/17/24 139/77  04/03/24 (!) 82/66   Wt Readings from Last 3 Encounters:  07/08/24 249 lb 3.2 oz (113 kg)  06/17/24 253 lb (114.8 kg)  04/03/24 264 lb (119.7 kg)        Physical Exam Skin:    Comments: Short  hair cut, no scarring alopecia visualized on exam     General: Alert, no acute distress Cardio: RRR, no r/m/g Pulm: CTAB, normal work of breathing     Results for orders placed or performed in visit on 07/08/24  Hemoglobin A1c  Result Value Ref Range   Hgb A1c MFr Bld 6.1 (H) 4.8 - 5.6 %   Est. average glucose Bld gHb  Est-mCnc 128 mg/dL  RFE85+ZHQM  Result Value Ref Range   Glucose 92 70 - 99 mg/dL   BUN 23 6 - 24 mg/dL   Creatinine, Ser 8.89 (H) 0.57 - 1.00 mg/dL   eGFR 60 >40 fO/fpw/8.26   BUN/Creatinine Ratio 21 9 - 23   Sodium 141 134 - 144 mmol/L   Potassium 4.1 3.5 - 5.2 mmol/L   Chloride 101 96 - 106 mmol/L   CO2 24 20 - 29 mmol/L   Calcium 10.0 8.7 - 10.2 mg/dL   Total Protein 7.1 6.0 - 8.5 g/dL   Albumin 4.6 3.8 - 4.9 g/dL   Globulin, Total 2.5 1.5 - 4.5 g/dL   Bilirubin Total 0.5 0.0 - 1.2 mg/dL   Alkaline Phosphatase 145 (H) 44 - 121 IU/L   AST 28 0 - 40 IU/L   ALT 31 0 - 32 IU/L    Assessment & Plan     Problem List Items Addressed This Visit     Hair loss   Relevant Medications   minoxidil  (LONITEN ) 2.5 MG tablet   Obesity, Class III, BMI 40-49.9 (morbid obesity)   Relevant Medications   glipiZIDE  (GLUCOTROL ) 5 MG tablet   Type 2 diabetes mellitus with diabetic neuropathy, without long-term current use of insulin (HCC) - Primary   Relevant Medications   glipiZIDE  (GLUCOTROL ) 5 MG tablet   Other Relevant Orders   Hemoglobin A1c (Completed)   Other Visit Diagnoses       Migraine without aura and without status migrainosus, not intractable       Relevant Medications   minoxidil  (LONITEN ) 2.5 MG tablet     Elevated alkaline phosphatase level       Relevant Orders   CMP14+EGFR (Completed)        Assessment & Plan Alopecia (nonscarring hair loss) Newly reported symptoms  Nonscarring hair loss with noticeable shedding. She is frustrated with previous dermatology consultations and is hesitant to pursue further specialist evaluations. Discussed  potential treatment options including spironolactone and minoxidil . Spironolactone may cause increased urination, and minoxidil  can be applied topically or taken orally to promote hair growth. - Prescribe minoxidil  5 mg once daily for hair growth  Migraine Chronic  Migraine symptoms have improved with current treatment. She reports only needing to use Imitrex  once since the last visit. - Continue current migraine management with Imitrex  as needed  Anxiety disorder Chronic  Suspect this is due to adjustment disorder with recent onset of stressful events  Anxiety remains a significant issue, particularly related to work stress. Xanax  0.5 mg has been effective in managing symptoms during work hours. She is open to exploring therapy options. - Continue Xanax  0.5 mg as needed for anxiety - Recommend exploring therapy options through [employer] for cost-effective and tailored counseling  Type 2 diabetes mellitus Chronic  Diabetes is currently well-controlled. Previous trials of metformin resulted in significant gastrointestinal side effects. Discussed alternative medication options to support Mounjaro  coverage. Glipizide  was chosen as it works differently from metformin and may help with insurance coverage for Mounjaro . - Recheck A1c to assess current diabetes control - Start glipizide  5 mg twice daily to support Mounjaro  coverage - unable to tolerate metformin 500mg  due to report of GI intolerance (diarrhea) during a trial on this medication to help with weight loss in the last 2 years  Obesity, class 3 Chronic Weight has decreased from 264 lbs in May to 249 lbs currently. She has previously tried multiple weight loss medications with varying success and side  effects. Current focus is on maintaining weight loss and exploring medication coverage options. - plan to resume tirzepatide  once insurance coverage is approved for diabetes management   Abnormal alkaline phosphatase Alkaline  phosphatase level is slightly elevated at 133 U/L, but not considered significantly abnormal. No immediate concerns based on current lab values. - repeat CMP ordered today      Return in about 3 months (around 10/07/2024) for DM, HTN.         Rockie Agent, MD  [facility] [phone] (phone) [phone] (fax)  [facility]

## 2024-07-08 NOTE — Patient Instructions (Addendum)
 [email or web address] - try searching for therapist or counselor using this website                             Contains text generated by [employer].

## 2024-07-09 ENCOUNTER — Encounter: Payer: Self-pay | Admitting: Family Medicine

## 2024-07-09 ENCOUNTER — Ambulatory Visit: Payer: Self-pay | Admitting: Family Medicine

## 2024-07-09 DIAGNOSIS — R748 Abnormal levels of other serum enzymes: Secondary | ICD-10-CM

## 2024-07-09 LAB — CMP14+EGFR
ALT: 31 IU/L (ref 0–32)
AST: 28 IU/L (ref 0–40)
Albumin: 4.6 g/dL (ref 3.8–4.9)
Alkaline Phosphatase: 145 IU/L — ABNORMAL HIGH (ref 44–121)
BUN/Creatinine Ratio: 21 (ref 9–23)
BUN: 23 mg/dL (ref 6–24)
Bilirubin Total: 0.5 mg/dL (ref 0.0–1.2)
CO2: 24 mmol/L (ref 20–29)
Calcium: 10 mg/dL (ref 8.7–10.2)
Chloride: 101 mmol/L (ref 96–106)
Creatinine, Ser: 1.1 mg/dL — ABNORMAL HIGH (ref 0.57–1.00)
Globulin, Total: 2.5 g/dL (ref 1.5–4.5)
Glucose: 92 mg/dL (ref 70–99)
Potassium: 4.1 mmol/L (ref 3.5–5.2)
Sodium: 141 mmol/L (ref 134–144)
Total Protein: 7.1 g/dL (ref 6.0–8.5)
eGFR: 60 mL/min/1.73 (ref >59)

## 2024-07-09 LAB — HEMOGLOBIN A1C
Est. average glucose Bld gHb Est-mCnc: 128 mg/dL
Hgb A1c MFr Bld: 6.1 % — ABNORMAL HIGH (ref 4.8–5.6)

## 2024-07-10 ENCOUNTER — Other Ambulatory Visit: Payer: Self-pay | Admitting: Family Medicine

## 2024-07-10 NOTE — Telephone Encounter (Unsigned)
 Copied from CRM [id number]. Topic: Clinical - Medication Refill >> Jul 10, 2024 12:23 PM Teressa P wrote: Medication: Generic Lodine  200 mg  Has the patient contacted their pharmacy? No  This is the patient's preferred pharmacy:   [id number] [place], [place] - [address] [place] Heidi Kline [place] [address] Phone: [phone] Fax: [phone]  Is this the correct pharmacy for this prescription? Yes If no, delete pharmacy and type the correct one.   Has the prescription been filled recently? Yes  Is the patient out of the medication? Yes  Has the patient been seen for an appointment in the last year OR does the patient have an upcoming appointment? yes  Can we respond through MyChart? No  Agent: Please be advised that Rx refills may take up to 3 business days. We ask that you follow-up with your pharmacy.

## 2024-07-10 NOTE — Telephone Encounter (Signed)
 Requested medication (s) are due for refill today - yes #30  Requested medication (s) are on the active medication list -yes  Future visit scheduled -no  Last refill: 05/28/24  Notes to clinic: manual review required  Requested Prescriptions  Pending Prescriptions Disp Refills   etodolac  (LODINE ) 200 MG capsule 30 capsule 0     Analgesics:  NSAIDS Failed - 07/10/2024  3:50 PM      Failed - Manual Review: Labs are only required if the patient has taken medication for more than 8 weeks.      Failed - Cr in normal range and within 360 days    Creatinine, Ser  Date Value Ref Range Status  07/08/2024 1.10 (H) 0.57 - 1.00 mg/dL Final         Passed - HGB in normal range and within 360 days    Hemoglobin  Date Value Ref Range Status  04/03/2024 12.2 11.1 - 15.9 g/dL Final         Passed - PLT in normal range and within 360 days    Platelets  Date Value Ref Range Status  04/03/2024 343 150 - 450 x10E3/uL Final         Passed - HCT in normal range and within 360 days    Hematocrit  Date Value Ref Range Status  04/03/2024 38.6 34.0 - 46.6 % Final         Passed - eGFR is 30 or above and within 360 days    eGFR  Date Value Ref Range Status  07/08/2024 60 >59 mL/min/1.73 Final         Passed - Patient is not pregnant      Passed - Valid encounter within last 12 months    Recent Outpatient Visits           2 days ago Type 2 diabetes mellitus with diabetic neuropathy, without long-term current use of insulin (HCC)   [place] [facility] Simmons-Robinson, [place], MD   3 weeks ago Migraine without aura and without status migrainosus, not intractable   [facility] [place], [place], MD   3 months ago Other specified hypotension   [place] [facility] [place], [place], PA-C   5 months ago Type 2 diabetes mellitus with diabetic neuropathy, without long-term current use of insulin (HCC)   [place]  [facility] Simmons-Robinson, [place], MD   7 months ago Pure hypercholesterolemia   [place] [facility] [place], [place], MD                 Requested Prescriptions  Pending Prescriptions Disp Refills   etodolac  (LODINE ) 200 MG capsule 30 capsule 0     Analgesics:  NSAIDS Failed - 07/10/2024  3:50 PM      Failed - Manual Review: Labs are only required if the patient has taken medication for more than 8 weeks.      Failed - Cr in normal range and within 360 days    Creatinine, Ser  Date Value Ref Range Status  07/08/2024 1.10 (H) 0.57 - 1.00 mg/dL Final         Passed - HGB in normal range and within 360 days    Hemoglobin  Date Value Ref Range Status  04/03/2024 12.2 11.1 - 15.9 g/dL Final         Passed - PLT in normal range and within 360 days    Platelets  Date Value Ref Range Status  04/03/2024 343 150 - 450 x10E3/uL Final         Passed - HCT in normal range and within 360 days    Hematocrit  Date Value Ref Range Status  04/03/2024 38.6 34.0 - 46.6 % Final         Passed - eGFR is 30 or above and within 360 days    eGFR  Date Value Ref Range Status  07/08/2024 60 >59 mL/min/1.73 Final         Passed - Patient is not pregnant      Passed - Valid encounter within last 12 months    Recent Outpatient Visits           2 days ago Type 2 diabetes mellitus with diabetic neuropathy, without long-term current use of insulin (HCC)   [place] [facility] Simmons-Robinson, [place], MD   3 weeks ago Migraine without aura and without status migrainosus, not intractable   [facility] [place], [place], MD   3 months ago Other specified hypotension   [place] [facility] [place], [place], PA-C   5 months ago Type 2 diabetes mellitus with diabetic neuropathy, without long-term current use of insulin (HCC)   [place] [facility]  Simmons-Robinson, [place], MD   7 months ago Pure hypercholesterolemia   [place] [facility] [place], Rockie, MD

## 2024-07-10 NOTE — Progress Notes (Signed)
 Per MyChart; Last read by Tereso KATHEE Tana Dorthea at 2:29AM on 07/10/2024.

## 2024-07-10 NOTE — Telephone Encounter (Signed)
 Spoke with pt and stated she spoke with another clinical staff regarding her results and informing pt someone from referral will call to schedule her imaging along with advising she needed to cancel her virtual appt for 07/11/24. Pt verbalzied understanding of results and imagining ordered.  Per her chart review pt has an appt with the pharmacy.

## 2024-07-11 ENCOUNTER — Other Ambulatory Visit: Payer: Self-pay

## 2024-07-11 NOTE — Progress Notes (Unsigned)
 S:     No chief complaint on file.   Reason for visit: ?  Heidi Kline is a 53 y.o. female with a history of diabetes ({Blank single:19197::type 1,type 2,unclear type}), who presents today for {lzvisittype:31254} diabetes pharmacotherapy visit.? Pertinent PMH also includes ***.  Known DM Complications: {DM complications:33329}   Care Team: Primary Care Provider: Sharma Coyer, MD  At last visit, ***.   Patient reports Diabetes was diagnosed in ***.   Current diabetes medications include: glipizide  5 mg BID Previous diabetes medications include: Mounjaro  (insurance denial), metformin (GI upset), Ozempic (GI upset) Current hypertension medications include: amlodipine  5 mg daily, linsinopril/hydrochlorothiazide  20/12.5 mg daily Current hyperlipidemia medications include: none  Patient reports adherence to taking all medications as prescribed.  *** Patient denies adherence with medications, reports missing *** medications *** times per week, on average.  Have you been experiencing any side effects to the medications prescribed? {YES NO:22349} Do you have any problems obtaining medications due to transportation or finances? yes Insurance coverage: BCBS  Patient {Actions; denies-reports:120008} hypoglycemic events.  Reported home fasting blood sugars: ***  Reported 2 hour post-meal/random blood sugars: ***.  Patient {Actions; denies-reports:120008} nocturia (nighttime urination).  Patient {Actions; denies-reports:120008} neuropathy (nerve pain). Patient {Actions; denies-reports:120008} visual changes. Patient {Actions; denies-reports:120008} self foot exams.   Patient reported dietary habits: Eats *** meals/day Breakfast: *** Lunch: *** Dinner: *** Snacks: *** Drinks: ***  Patient-reported exercise habits: *** DM Prevention:  Statin: Not taking; {Blank single:19197::low intensity,moderate intensity,high intensity,n/a}.?  History of  albuminuria? no, last UACR on 10/08/23 = 4 mg/g Last eye exam: ***; {lzdmeyeexam:31121} Tobacco Use:   Tobacco Use: Low Risk  (07/09/2024)   Patient History    Smoking Tobacco Use: Never    Smokeless Tobacco Use: Never    Passive Exposure: Not on file   O:  Vitals:  Wt Readings from Last 3 Encounters:  07/08/24 249 lb 3.2 oz (113 kg)  06/17/24 253 lb (114.8 kg)  06/13/24 253 lb (114.8 kg)   BP Readings from Last 3 Encounters:  07/08/24 126/64  06/17/24 139/77  06/13/24 138/80   Pulse Readings from Last 3 Encounters:  07/08/24 76  06/17/24 69  06/13/24 73     Labs:?  Lab Results  Component Value Date   HGBA1C 6.1 (H) 07/08/2024   HGBA1C 6.7 (H) 01/24/2024   HGBA1C 6.6 (H) 07/16/2023   GLUCOSE 92 07/08/2024   MICRALBCREAT 4 10/08/2023   CREATININE 1.10 (H) 07/08/2024   CREATININE 0.96 06/17/2024   CREATININE 1.18 (H) 04/03/2024    Lab Results  Component Value Date   CHOL 211 (H) 07/16/2023   LDLCALC 137 (H) 07/16/2023   HDL 60 07/16/2023   TRIG 79 07/16/2023   ALT 31 07/08/2024   ALT 31 06/17/2024   AST 28 07/08/2024   AST 32 06/17/2024      Chemistry      Component Value Date/Time   NA 141 07/08/2024 1537   K 4.1 07/08/2024 1537   CL 101 07/08/2024 1537   CO2 24 07/08/2024 1537   BUN 23 07/08/2024 1537   CREATININE 1.10 (H) 07/08/2024 1537      Component Value Date/Time   CALCIUM 10.0 07/08/2024 1537   ALKPHOS 145 (H) 07/08/2024 1537   AST 28 07/08/2024 1537   ALT 31 07/08/2024 1537   BILITOT 0.5 07/08/2024 1537       The 10-year ASCVD risk score (Arnett DK, et al., 2019) is: 9.7%  Lab Results  Component Value Date  MICRALBCREAT 4 10/08/2023    A/P: Diabetes currently *** with a most recent A1c of *** on ***, which is {DESC; UP/DOWN/UNCHANGED:18711} from *** on ***. Patient is *** able to verbalize appropriate hypoglycemia management plan. Medication adherence appears ***. Control is suboptimal due to ***. -{Meds adjust:18428} SGLT2-I ***  Farxiga (dapagliflozin) *** Jardiance (empagliflozin) 10 mg. Counseled on sick day rules. -{Meds adjust:18428} glipizide  5 mg BID -Patient educated on purpose, proper use, and potential adverse effects of ***.  -Extensively discussed pathophysiology of diabetes, recommended lifestyle interventions, dietary effects on blood sugar control.  -Counseled on s/sx of and management of hypoglycemia.  -Next A1c anticipated ***.   ASCVD risk - primary prevention in patient with diabetes. Last LDL is 137 mg/dL, not at goal of <29 mg/dL. 10-year ASCVD risk score of 9.7%. moderate intensity statin indicated.  -{Meds adjust:18428} ***statin *** mg daily.   Hypertension longstanding currently controlled. Blood pressure goal of <130/80 mmHg. Medication adherence ***. Blood pressure control is suboptimal due to ***. -Continued amlodipine  5 mg daily. -Continued lisinopril /hydrochlorothiazide  20/12.5 mg daily  Patient verbalized understanding of treatment plan. Total time patient counseling *** minutes.  Follow-up:  Pharmacist on ***  Peyton CHARLENA Ferries, PharmD Clinical Pharmacist [facility] [phone]

## 2024-07-11 NOTE — Telephone Encounter (Signed)
 Requested Prescriptions  Pending Prescriptions Disp Refills   etodolac  (LODINE ) 200 MG capsule [Pharmacy Med Name: ETODOLAC  200 MG CAPSULE] 90 capsule 0    Sig: TAKE 1 CAPSULE (200 MG TOTAL) BY MOUTH EVERY 8 HOURS     Analgesics:  NSAIDS Failed - 07/11/2024  8:18 AM      Failed - Manual Review: Labs are only required if the patient has taken medication for more than 8 weeks.      Failed - Cr in normal range and within 360 days    Creatinine, Ser  Date Value Ref Range Status  07/08/2024 1.10 (H) 0.57 - 1.00 mg/dL Final         Passed - HGB in normal range and within 360 days    Hemoglobin  Date Value Ref Range Status  04/03/2024 12.2 11.1 - 15.9 g/dL Final         Passed - PLT in normal range and within 360 days    Platelets  Date Value Ref Range Status  04/03/2024 343 150 - 450 x10E3/uL Final         Passed - HCT in normal range and within 360 days    Hematocrit  Date Value Ref Range Status  04/03/2024 38.6 34.0 - 46.6 % Final         Passed - eGFR is 30 or above and within 360 days    eGFR  Date Value Ref Range Status  07/08/2024 60 >59 mL/min/1.73 Final         Passed - Patient is not pregnant      Passed - Valid encounter within last 12 months    Recent Outpatient Visits           3 days ago Type 2 diabetes mellitus with diabetic neuropathy, without long-term current use of insulin (HCC)   [place] [facility] Simmons-Robinson, [place], MD   3 weeks ago Migraine without aura and without status migrainosus, not intractable   [place] [facility] [place], [place], MD   3 months ago Other specified hypotension   [place] [facility] [place], [place], PA-C   5 months ago Type 2 diabetes mellitus with diabetic neuropathy, without long-term current use of insulin (HCC)   [place] [facility] Simmons-Robinson, [place], MD   7 months ago Pure hypercholesterolemia   [place] [facility] [place], Rockie, MD

## 2024-07-15 ENCOUNTER — Ambulatory Visit
Admission: RE | Admit: 2024-07-15 | Discharge: 2024-07-15 | Disposition: A | Discharge status: Home / Self Care | Source: Ambulatory Visit | Attending: Family Medicine | Admitting: Family Medicine

## 2024-07-15 DIAGNOSIS — R748 Abnormal levels of other serum enzymes: Secondary | ICD-10-CM | POA: Insufficient documentation

## 2024-07-18 ENCOUNTER — Telehealth

## 2024-07-18 ENCOUNTER — Ambulatory Visit: Payer: Self-pay | Admitting: Family Medicine

## 2024-07-21 ENCOUNTER — Encounter: Payer: Self-pay | Admitting: Family Medicine

## 2024-07-21 ENCOUNTER — Other Ambulatory Visit (HOSPITAL_COMMUNITY)
Admission: RE | Admit: 2024-07-21 | Discharge: 2024-07-21 | Disposition: A | Discharge status: Home / Self Care | Source: Ambulatory Visit | Attending: Family Medicine | Admitting: Family Medicine

## 2024-07-21 ENCOUNTER — Ambulatory Visit: Admitting: Family Medicine

## 2024-07-21 VITALS — BP 107/70 | HR 72 | Temp 98.2°F | Ht 64.0 in | Wt 245.3 lb

## 2024-07-21 DIAGNOSIS — Z7984 Long term (current) use of oral hypoglycemic drugs: Secondary | ICD-10-CM

## 2024-07-21 DIAGNOSIS — N898 Other specified noninflammatory disorders of vagina: Secondary | ICD-10-CM | POA: Insufficient documentation

## 2024-07-21 DIAGNOSIS — E1140 Type 2 diabetes mellitus with diabetic neuropathy, unspecified: Secondary | ICD-10-CM | POA: Diagnosis not present

## 2024-07-21 DIAGNOSIS — N3941 Urge incontinence: Secondary | ICD-10-CM

## 2024-07-21 DIAGNOSIS — R102 Pelvic and perineal pain: Secondary | ICD-10-CM

## 2024-07-21 DIAGNOSIS — E66813 Obesity, class 3: Secondary | ICD-10-CM | POA: Diagnosis not present

## 2024-07-21 DIAGNOSIS — K760 Fatty (change of) liver, not elsewhere classified: Secondary | ICD-10-CM | POA: Diagnosis not present

## 2024-07-21 DIAGNOSIS — F419 Anxiety disorder, unspecified: Secondary | ICD-10-CM

## 2024-07-21 MED ORDER — PHENAZOPYRIDINE HCL 200 MG PO TABS: 200.0000 mg | ORAL_TABLET | Freq: Three times a day (TID) | ORAL | 0 refills | Status: DC | PRN

## 2024-07-21 NOTE — Patient Instructions (Signed)
 To keep you healthy, please keep in mind the following health maintenance items that you are due for:   Health Maintenance Due  Topic Date Due   FOOT EXAM  Never done   OPHTHALMOLOGY EXAM  Never done   Pneumococcal Vaccine: 50+ Years (1 of 2 - PCV) Never done   Hepatitis B Vaccines 19-59 Average Risk (1 of 3 - 19+ 3-dose series) Never done   Colonoscopy  Never done   Zoster Vaccines- Shingrix (1 of 2) Never done   Influenza Vaccine  06/06/2024   COVID-19 Vaccine (2 - 2025-26 season) 07/07/2024   Mammogram  09/05/2024     Best Wishes,   Dr. Lang

## 2024-07-21 NOTE — Progress Notes (Signed)
 "     Established patient visit   Patient: Heidi Kline   DOB: 10/18/1971   52 y.o. Female  MRN: [id number] Visit Date: 07/21/2024  Today's healthcare provider: Rockie Agent, MD   Chief Complaint  Patient presents with   Medical Management of Chronic Issues    Patient is present for f/u of chronic conditions. Patient is requesting another sample of mounjaro , states she is having issues with payment or glipizide , and wanted to make sure she took something while she is getting the money to pay for it.   States you have previously discussed fatty liver tissue and treatment for that, would like to discuss today    sti screening    Patient is requesting repeat sti screening, states she is still having the same symptoms, with discharge clear now. Has some urinary frequency as well    Subjective     HPI     Medical Management of Chronic Issues    Additional comments: Patient is present for f/u of chronic conditions. Patient is requesting another sample of mounjaro , states she is having issues with payment or glipizide , and wanted to make sure she took something while she is getting the money to pay for it.   States you have previously discussed fatty liver tissue and treatment for that, would like to discuss today         sti screening    Additional comments: Patient is requesting repeat sti screening, states she is still having the same symptoms, with discharge clear now. Has some urinary frequency as well       Last edited by Cherry Chiquita HERO, CMA on 07/21/2024  2:19 PM.       Discussed the use of AI scribe software for clinical note transcription with the patient, who gave verbal consent to proceed.  History of Present Illness Heidi Kline is a 53 year old female with diabetes, metabolic dysfunction, and class three obesity who presents for chronic condition management.  She is experiencing issues with diabetes management. Previously on  Mounjaro , which is no longer covered by her insurance, she is requesting a sample. She is also having difficulty affording glipizide , which costs $45/mo, and is concerned about maintaining her weight loss achieved with Mounjaro . She has been off Mounjaro  since the last sample and is worried about gaining weight. She has been drinking Yerby tea to help with appetite control and has lost four pounds in the last two weeks.  She reports ongoing urinary symptoms, including urinary frequency and clear vaginal discharge. She has been tested for STIs multiple times, with previous tests returning negative results, but continues to feel unwell in the pelvic region. She describes persistent discomfort and pain in the lower abdomen.She has been using Pyridium  and cranberry juice to manage symptoms, but reports no relief. No burning or pain during urination.  She is dealing with anxiety, exacerbated by financial stress and job-related issues. She experiences high anxiety days and crying spells, particularly over the weekend, and is concerned about job security due to frequent medical appointments. She has been using anxiety medication but continues to feel overwhelmed.  She has a history of fatty liver disease and is concerned about its management. She understands the importance of controlling her diabetes and dietary habits in managing her liver condition.  She has neuropathy and has been using a device called a neuropath to manage her foot pain. The device provides shockwaves and has various settings for massage, which she finds  effective in reducing her pain. She has been using it daily and reports significant relief from her neuropathy symptoms.     Past Medical History:  Diagnosis Date   Hypertension    Neuropathy     Medications: Outpatient Medications Prior to Visit  Medication Sig   acyclovir  (ZOVIRAX ) 400 MG tablet TAKE 1 TABLET BY MOUTH TWICE A DAY   ALPRAZolam  (XANAX ) 0.5 MG tablet Take 1 tablet  (0.5 mg total) by mouth 2 (two) times daily as needed for anxiety.   amLODipine  (NORVASC ) 5 MG tablet Take 1 tablet (5 mg total) by mouth daily.   etodolac  (LODINE ) 200 MG capsule TAKE 1 CAPSULE (200 MG TOTAL) BY MOUTH EVERY 8 HOURS   famciclovir  (FAMVIR ) 250 MG tablet Take 1 tablet (250 mg total) by mouth 2 (two) times daily.   fluconazole  (DIFLUCAN ) 150 MG tablet Take one 150 mg tablet by mouth at onset of symptoms and repeat after 72 hours if symptoms persist   lisinopril -hydrochlorothiazide  (ZESTORETIC ) 20-12.5 MG tablet Take 1 tablet by mouth daily.   minoxidil  (LONITEN ) 2.5 MG tablet Take 1 tablet (2.5 mg total) by mouth daily.   SUMAtriptan  (IMITREX ) 25 MG tablet Take 1 tablet (25 mg total) by mouth every 2 (two) hours as needed for migraine. May repeat in 2 hours if headache persists or recurs.   tirzepatide  (MOUNJARO ) 5 MG/0.5ML Pen Inject 5 mg into the skin once a week.   tiZANidine  (ZANAFLEX ) 4 MG tablet Take 1 tablet (4 mg total) by mouth every 6 (six) hours as needed for muscle spasms.   glipiZIDE  (GLUCOTROL ) 5 MG tablet Take 1 tablet (5 mg total) by mouth 2 (two) times daily before a meal. (Patient not taking: Reported on 07/21/2024)   No facility-administered medications prior to visit.   US  Abdomen Limited RUQ (LIVER/GB) Result Date: 07/18/2024 CLINICAL DATA:  Elevated alkaline phosphatase EXAM: ULTRASOUND ABDOMEN LIMITED RIGHT UPPER QUADRANT COMPARISON:  None available FINDINGS: Gallbladder: Gallstones: None Sludge: None Gallbladder Wall: Within normal limits Pericholecystic fluid: None Sonographic Murphy's Sign: Negative per technologist Common bile duct: Diameter: 2 mm Liver: Parenchymal echogenicity: Diffusely echogenic with sparing along gallbladder fossa Contours: Normal Lesions: None Portal vein: Patent.  Hepatopetal flow Other: None. IMPRESSION: Diffuse increased echogenicity of the hepatic parenchyma is a nonspecific indicator of hepatocellular dysfunction, most commonly  steatosis. Electronically Signed   By: Aliene Lloyd M.D.   On: 07/18/2024 08:06     Review of Systems  Last metabolic panel Lab Results  Component Value Date   GLUCOSE 92 07/08/2024   NA 141 07/08/2024   K 4.1 07/08/2024   CL 101 07/08/2024   CO2 24 07/08/2024   BUN 23 07/08/2024   CREATININE 1.10 (H) 07/08/2024   EGFR 60 07/08/2024   CALCIUM 10.0 07/08/2024   PROT 7.1 07/08/2024   ALBUMIN 4.6 07/08/2024   LABGLOB 2.5 07/08/2024   AGRATIO 2.0 03/29/2023   BILITOT 0.5 07/08/2024   ALKPHOS 145 (H) 07/08/2024   AST 28 07/08/2024   ALT 31 07/08/2024   Last lipids Lab Results  Component Value Date   CHOL 211 (H) 07/16/2023   HDL 60 07/16/2023   LDLCALC 137 (H) 07/16/2023   TRIG 79 07/16/2023   CHOLHDL 3.5 07/16/2023   Last hemoglobin A1c Lab Results  Component Value Date   HGBA1C 6.1 (H) 07/08/2024   Last thyroid  functions Lab Results  Component Value Date   TSH 0.989 01/24/2024        Objective    BP 107/70 (  BP Location: Left Arm, Patient Position: Sitting, Cuff Size: Normal)   Pulse 72   Temp 98.2 F (36.8 C) (Oral)   Ht 5' 4 (1.626 m)   Wt 245 lb 4.8 oz (111.3 kg)   SpO2 100%   BMI 42.11 kg/m   BP Readings from Last 3 Encounters:  07/21/24 107/70  07/08/24 126/64  06/17/24 139/77   Wt Readings from Last 3 Encounters:  07/21/24 245 lb 4.8 oz (111.3 kg)  07/08/24 249 lb 3.2 oz (113 kg)  06/17/24 253 lb (114.8 kg)        Physical Exam Cardiovascular:     Pulses:          Dorsalis pedis pulses are 2+ on the right side and 2+ on the left side.       Posterior tibial pulses are 2+ on the right side and 2+ on the left side.  Abdominal:     General: There is no distension.     Palpations: Abdomen is soft. There is no mass.     Tenderness: There is abdominal tenderness in the epigastric area and suprapubic area.  Musculoskeletal:     Right foot: Normal range of motion. No deformity or bunion.     Left foot: Normal range of motion. No  deformity or bunion.  Feet:     Right foot:     Protective Sensation: 6 sites tested.  6 sites sensed.     Skin integrity: Skin integrity normal. No erythema.     Toenail Condition: Right toenails are normal.     Left foot:     Protective Sensation: 6 sites tested.  6 sites sensed.     Skin integrity: Skin integrity normal. No erythema.     Toenail Condition: Left toenails are normal.       Results for orders placed or performed in visit on 07/21/24  Urine Culture   Specimen: Urine   Urine  Result Value Ref Range   Urine Culture, Routine Preliminary report    Organism ID, Bacteria Comment     Assessment & Plan     Problem List Items Addressed This Visit     Anxiety   Anxiety disorder Chronic anxiety exacerbated by current health and financial stressors. Symptoms include high anxiety days and crying spells. Current medication provides some relief. - Provide a doctor's note for work to address anxiety-related absences. - continue Xanax  0.5mg  BID PRN       Metabolic dysfunction-associated steatotic liver   Metabolic dysfunction-associated fatty liver disease Chronic condition associated with diabetes and obesity. Discussed the importance of controlling diabetes and dietary modifications to prevent further liver damage. No current plan for antibiotics due to risk of yeast infections. - Focus on diabetes management to control fatty liver disease. - Advise dietary modifications to reduce sugar intake. - pt declined started Actos at  this time      Obesity, Class III, BMI 40-49.9 (morbid obesity)    Obesity, class 3 Chronic condition contributing to metabolic dysfunction and diabetes. Previous weight loss achieved with Mounjaro , which is currently unaffordable. - Provide Mounjaro  2.5 mg samples to aid in weight management. - Explore affordable options for Mounjaro  or alternative medications.      Type 2 diabetes mellitus with diabetic neuropathy, without long-term current  use of insulin (HCC) - Primary   Type 2 diabetes mellitus with diabetic neuropathy Chronic condition with challenges in medication management due to insurance coverage issues. Previously on Mounjaro , which was effective for weight loss  and had minimal side effects, but is no longer covered by insurance. Glipizide  is also unaffordable at present. Neuropathy symptoms are managed with a neuropathy device providing relief. - Provide Mounjaro  2.5 mg samples. - Explore options for affordable Mounjaro  through pharmacy assistance programs. - Considered rybelsus but pt had negative SE with ozempic so concern that she may have SE with the oral semaglutide as well  - glipizide  is too expensive to start right now -given GU symptoms, I am hesistant to start SGLT2i for concern of additional SE  - discussed Actos for added metabolic liver dysfunction management, pt politely declines at this time  - Continue using neuropathy device for symptom relief.      Urge incontinence    Chronic pelvic and perineal pain Persistent pelvic pain with unclear etiology. Previous STI tests negative, but symptoms persist. Possible chronic bladder inflammation suggested. - Refer to urogynecology or urology for further evaluation and possible cystoscopy  Urinary frequency and urge incontinence Chronic urinary symptoms with negative STI tests. Possible chronic bladder inflammation. Pyridium  used for symptom relief, but symptoms persist. - Refer to urogynecology or urology for further evaluation. - Continue Pyridium  for symptom management. - repeat Gonorrhea,chlamydia, BV and candidiasis testing       Other Visit Diagnoses       Vaginal discharge       Relevant Orders   Cervicovaginal ancillary only     Suprapubic pressure       Relevant Orders   Urine Culture (Completed)   Ambulatory referral to Urogynecology       Assessment and Plan Assessment & Plan      Vaginal discharge, evaluation for noninflammatory  vaginal disorder Persistent vaginal discharge with previous negative STI tests. Current symptoms include clear discharge and urinary frequency. - Perform swab for gonorrhea, chlamydia, and trichomoniasis.   -      Return in about 3 months (around 10/20/2024) for DM, HTN, Anxiety.       Rockie Agent, MD  [facility] [phone] (phone) [phone] (fax)  [facility] "

## 2024-07-23 NOTE — Assessment & Plan Note (Signed)
 Anxiety disorder Chronic anxiety exacerbated by current health and financial stressors. Symptoms include high anxiety days and crying spells. Current medication provides some relief. - Provide a doctor's note for work to address anxiety-related absences. - continue Xanax  0.5mg  BID PRN

## 2024-07-23 NOTE — Assessment & Plan Note (Signed)
 Type 2 diabetes mellitus with diabetic neuropathy Chronic condition with challenges in medication management due to insurance coverage issues. Previously on Mounjaro , which was effective for weight loss and had minimal side effects, but is no longer covered by insurance. Glipizide  is also unaffordable at present. Neuropathy symptoms are managed with a neuropathy device providing relief. - Provide Mounjaro  2.5 mg samples. - Explore options for affordable Mounjaro  through pharmacy assistance programs. - Considered rybelsus but pt had negative SE with ozempic so concern that she may have SE with the oral semaglutide as well  - glipizide  is too expensive to start right now -given GU symptoms, I am hesistant to start SGLT2i for concern of additional SE  - discussed Actos for added metabolic liver dysfunction management, pt politely declines at this time  - Continue using neuropathy device for symptom relief.

## 2024-07-23 NOTE — Assessment & Plan Note (Signed)
  Obesity, class 3 Chronic condition contributing to metabolic dysfunction and diabetes. Previous weight loss achieved with Mounjaro , which is currently unaffordable. - Provide Mounjaro  2.5 mg samples to aid in weight management. - Explore affordable options for Mounjaro  or alternative medications.

## 2024-07-23 NOTE — Assessment & Plan Note (Signed)
  Chronic pelvic and perineal pain Persistent pelvic pain with unclear etiology. Previous STI tests negative, but symptoms persist. Possible chronic bladder inflammation suggested. - Refer to urogynecology or urology for further evaluation and possible cystoscopy  Urinary frequency and urge incontinence Chronic urinary symptoms with negative STI tests. Possible chronic bladder inflammation. Pyridium  used for symptom relief, but symptoms persist. - Refer to urogynecology or urology for further evaluation. - Continue Pyridium  for symptom management. - repeat Gonorrhea,chlamydia, BV and candidiasis testing

## 2024-07-23 NOTE — Assessment & Plan Note (Signed)
 Metabolic dysfunction-associated fatty liver disease Chronic condition associated with diabetes and obesity. Discussed the importance of controlling diabetes and dietary modifications to prevent further liver damage. No current plan for antibiotics due to risk of yeast infections. - Focus on diabetes management to control fatty liver disease. - Advise dietary modifications to reduce sugar intake. - pt declined started Actos at  this time

## 2024-07-24 LAB — CERVICOVAGINAL ANCILLARY ONLY
Bacterial Vaginitis (gardnerella): NEGATIVE
Candida Glabrata: NEGATIVE
Candida Vaginitis: NEGATIVE
Chlamydia: NEGATIVE
Comment: NEGATIVE
Comment: NEGATIVE
Comment: NEGATIVE
Comment: NEGATIVE
Comment: NEGATIVE
Comment: NORMAL
Neisseria Gonorrhea: NEGATIVE
Trichomonas: NEGATIVE

## 2024-07-24 LAB — URINE CULTURE

## 2024-07-26 ENCOUNTER — Ambulatory Visit: Payer: Self-pay | Admitting: Family Medicine

## 2024-07-26 DIAGNOSIS — N390 Urinary tract infection, site not specified: Secondary | ICD-10-CM

## 2024-07-26 MED ORDER — FLUCONAZOLE 150 MG PO TABS: ORAL_TABLET | ORAL | 0 refills | Status: AC

## 2024-07-26 MED ORDER — SULFAMETHOXAZOLE-TRIMETHOPRIM 800-160 MG PO TABS: 1.0000 | ORAL_TABLET | Freq: Two times a day (BID) | ORAL | 0 refills | Status: AC

## 2024-08-13 ENCOUNTER — Other Ambulatory Visit: Payer: Self-pay | Admitting: Family Medicine

## 2024-08-14 ENCOUNTER — Other Ambulatory Visit: Payer: Self-pay | Admitting: Family Medicine

## 2024-09-09 ENCOUNTER — Other Ambulatory Visit: Payer: Self-pay | Admitting: Family Medicine

## 2024-09-09 DIAGNOSIS — I10 Essential (primary) hypertension: Secondary | ICD-10-CM

## 2024-09-14 ENCOUNTER — Other Ambulatory Visit: Payer: Self-pay | Admitting: Family Medicine

## 2024-09-14 DIAGNOSIS — G5792 Unspecified mononeuropathy of left lower limb: Secondary | ICD-10-CM

## 2024-09-14 DIAGNOSIS — M5431 Sciatica, right side: Secondary | ICD-10-CM

## 2024-10-21 ENCOUNTER — Encounter: Payer: Self-pay | Admitting: Family Medicine

## 2024-10-21 ENCOUNTER — Ambulatory Visit (INDEPENDENT_AMBULATORY_CARE_PROVIDER_SITE_OTHER): Admitting: Family Medicine

## 2024-10-21 VITALS — BP 128/74 | HR 85 | Ht 64.0 in | Wt 253.4 lb

## 2024-10-21 DIAGNOSIS — H01119 Allergic dermatitis of unspecified eye, unspecified eyelid: Secondary | ICD-10-CM

## 2024-10-21 DIAGNOSIS — E1140 Type 2 diabetes mellitus with diabetic neuropathy, unspecified: Secondary | ICD-10-CM | POA: Diagnosis not present

## 2024-10-21 DIAGNOSIS — A609 Anogenital herpesviral infection, unspecified: Secondary | ICD-10-CM

## 2024-10-21 DIAGNOSIS — I10 Essential (primary) hypertension: Secondary | ICD-10-CM

## 2024-10-21 DIAGNOSIS — Z7985 Long-term (current) use of injectable non-insulin antidiabetic drugs: Secondary | ICD-10-CM | POA: Diagnosis not present

## 2024-10-21 DIAGNOSIS — F419 Anxiety disorder, unspecified: Secondary | ICD-10-CM | POA: Diagnosis not present

## 2024-10-21 DIAGNOSIS — Z87440 Personal history of urinary (tract) infections: Secondary | ICD-10-CM

## 2024-10-21 LAB — POCT URINALYSIS DIPSTICK
Bilirubin, UA: NEGATIVE
Blood, UA: NEGATIVE
Glucose, UA: NEGATIVE
Ketones, UA: NEGATIVE
Leukocytes, UA: NEGATIVE
Nitrite, UA: NEGATIVE
Odor: NORMAL
Protein, UA: POSITIVE — AB
Spec Grav, UA: 1.02 (ref 1.010–1.025)
Urobilinogen, UA: 0.2 U/dL
pH, UA: 6 (ref 5.0–8.0)

## 2024-10-21 MED ORDER — ACYCLOVIR 5 % EX CREA: 1.0000 | TOPICAL_CREAM | CUTANEOUS | 3 refills | Status: AC

## 2024-10-21 MED ORDER — TRIAMCINOLONE ACETONIDE 0.1 % EX OINT: 1.0000 | TOPICAL_OINTMENT | Freq: Two times a day (BID) | CUTANEOUS | 0 refills | Status: AC

## 2024-10-21 NOTE — Patient Instructions (Signed)
 To keep you healthy, please keep in mind the following health maintenance items that you are due for:   Health Maintenance Due  Topic Date Due   OPHTHALMOLOGY EXAM  Never done   Pneumococcal Vaccine: 50+ Years (1 of 2 - PCV) Never done   Hepatitis B Vaccines 19-59 Average Risk (1 of 3 - 19+ 3-dose series) Never done   Colonoscopy  Never done   Zoster Vaccines- Shingrix (1 of 2) Never done   Influenza Vaccine  06/06/2024   COVID-19 Vaccine (5 - 2025-26 season) 07/07/2024   Diabetic kidney evaluation - Urine ACR  10/07/2024   Mammogram  09/05/2024     Best Wishes,   Dr. Lang

## 2024-10-21 NOTE — Assessment & Plan Note (Signed)
 Hx herpes simplex infection Chronic  She has acyclovir  pills for management but requires topical cream for outbreaks. - Prescribed acyclovir  5% cream for topical use.

## 2024-10-21 NOTE — Progress Notes (Signed)
 Established patient visit   Patient: Heidi Kline   DOB: November 13, 1970   53 y.o. Female  MRN: [id number] Visit Date: 10/21/2024  Today's healthcare provider: Rockie Agent, MD   Chief Complaint  Patient presents with   Medical Management of Chronic Issues    Patient is present for chronic f/u with PCP.   Requests fills of acyclovir  cream, ran out of tablets    Subjective     HPI     Medical Management of Chronic Issues    Additional comments: Patient is present for chronic f/u with PCP.   Requests fills of acyclovir  cream, ran out of tablets       Last edited by Cherry Chiquita HERO, CMA on 10/21/2024  1:50 PM.       Discussed the use of AI scribe software for clinical note transcription with the patient, who gave verbal consent to proceed.  History of Present Illness Heidi Kline is a 53 year old female with hypertension, diabetes, and anxiety who presents for a chronic follow-up visit.  Her diabetes is well-controlled with a recent A1c of 6.1. She has been using 2.5 mg samples of Mounjaro  for diabetes management as her insurance does not cover it.  She experienced an allergic reaction to eyelash extensions, resulting in itchy, burning, and puffy eyes. She has been using Benadryl to manage the symptoms and inquires about a cream for the sores that developed. She removed the extensions herself due to the irritation.  She has a history of recurrent urinary tract infections and has been on multiple antibiotics. Recently, she completed a course of antibiotics that alleviated her symptoms, but she experienced a yeast infection as a side effect, for which she used Monistat.  She mentions a past colonoscopy done in 2021 at [facility], which showed no polyps but internal hemorrhoids. She also had a breast exam and a diabetic eye exam recently. She had a hysterectomy in the past, which she notes when discussing the need for a Pap smear.  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 She reflects on changes in healthcare over her 36-year career and her experiences working in hematology and oncology.     Past Medical History:  Diagnosis Date   Hypertension    Neuropathy     Medications: Show/hide medication list[1]  Review of Systems  Last CBC Lab Results  Component Value Date   WBC 8.4 04/03/2024   HGB 12.2 04/03/2024   HCT 38.6 04/03/2024   MCV 87 04/03/2024   MCH 27.5 04/03/2024   RDW 12.9 04/03/2024   PLT 343 04/03/2024   Last metabolic panel Lab Results  Component Value Date   GLUCOSE 92 07/08/2024   NA 141 07/08/2024   K 4.1 07/08/2024   CL 101 07/08/2024   CO2 24 07/08/2024   BUN 23 07/08/2024   CREATININE 1.10 (H) 07/08/2024   EGFR 60 07/08/2024   CALCIUM 10.0 07/08/2024   PROT 7.1 07/08/2024   ALBUMIN 4.6 07/08/2024   LABGLOB 2.5 07/08/2024   AGRATIO 2.0 03/29/2023   BILITOT 0.5 07/08/2024   ALKPHOS 145 (H) 07/08/2024   AST 28 07/08/2024   ALT 31 07/08/2024   Last lipids Lab Results  Component Value Date   CHOL 211 (H) 07/16/2023   HDL 60 07/16/2023   LDLCALC 137 (H) 07/16/2023   TRIG 79 07/16/2023   CHOLHDL 3.5 07/16/2023   Last  hemoglobin A1c Lab Results  Component Value Date   HGBA1C 6.1 (H) 07/08/2024   Last thyroid  functions Lab Results  Component Value Date   TSH 0.989 01/24/2024   FREET4 1.04 01/24/2024   Last vitamin D  Lab Results  Component Value Date   VD25OH 30.5 01/24/2024   Last vitamin B12 and Folate Lab Results  Component Value Date   VITAMINB12 490 01/24/2024        Objective    BP 128/74 (BP Location: Left Arm, Patient Position: Sitting, Cuff Size: Normal)   Pulse 85   Ht 5' 4 (1.626 m)   Wt 253 lb 6.4 oz (114.9 kg)   SpO2 100%   BMI 43.50 kg/m   BP Readings from Last 3 Encounters:  10/21/24 128/74  07/21/24 107/70  07/08/24 126/64   Wt Readings from Last 3 Encounters:  10/21/24 253 lb 6.4 oz  (114.9 kg)  07/21/24 245 lb 4.8 oz (111.3 kg)  07/08/24 249 lb 3.2 oz (113 kg)        Physical Exam Vitals reviewed.  Constitutional:      General: She is not in acute distress.    Appearance: Normal appearance. She is not ill-appearing.  Cardiovascular:     Rate and Rhythm: Normal rate and regular rhythm.  Pulmonary:     Effort: Pulmonary effort is normal. No respiratory distress.     Breath sounds: No wheezing, rhonchi or rales.  Neurological:     Mental Status: She is alert and oriented to person, place, and time.  Psychiatric:        Mood and Affect: Mood normal.        Behavior: Behavior normal.       Results for orders placed or performed in visit on 10/21/24  POCT Urinalysis Dipstick  Result Value Ref Range   Color, UA yellow    Clarity, UA clear    Glucose, UA Negative Negative   Bilirubin, UA negative    Ketones, UA negative    Spec Grav, UA 1.020 1.010 - 1.025   Blood, UA negative    pH, UA 6.0 5.0 - 8.0   Protein, UA Positive (A) Negative   Urobilinogen, UA 0.2 0.2 or 1.0 E.U./dL   Nitrite, UA negative    Leukocytes, UA Negative Negative   Appearance clear    Odor normal     Assessment & Plan     Problem List Items Addressed This Visit     Anxiety   Anxiety disorder Chronic anxiety exacerbated by current health and financial stressors. Symptoms include high anxiety days and crying spells. Current medication provides some relief. - Provide a doctor's note for work to address anxiety-related absences. - continue Xanax  0.5mg  BID PRN       HSV (herpes simplex virus) anogenital infection   Hx herpes simplex infection Chronic  She has acyclovir  pills for management but requires topical cream for outbreaks. - Prescribed acyclovir  5% cream for topical use.      Relevant Medications   acyclovir  cream (ZOVIRAX ) 5 %   Hypertension - Primary   Primary hypertension Chronic  Blood pressure is well-controlled at 128/74 mmHg. - continue amlodipine  5mg   daily  - continue lisinopril -hydrochlorothiazide  20-12.5mg  daily       Type 2 diabetes mellitus with diabetic neuropathy, without long-term current use of insulin (HCC)   Type 2 diabetes mellitus with diabetic neuropathy Chronic  Diabetes is well-controlled with an A1c of 6.1. Insurance issues with Mounjaro  have been addressed through a pharmacy  program, allowing continued access to medication. - Continue Mounjaro  2.5 mg as per pharmacy program. - Will repeat A1c in March.      Relevant Orders   Urine Albumin/Creatinine with ratio (send out) [LAB689]   Other Visit Diagnoses       Eyelid dermatitis, allergic/contact       Relevant Medications   triamcinolone  ointment (KENALOG ) 0.1 %     History of UTI       Relevant Medications   acyclovir  cream (ZOVIRAX ) 5 %   Other Relevant Orders   POCT Urinalysis Dipstick (Completed)       Assessment and Plan Assessment & Plan    Allergic eyelid dermatitis Eyelid dermatitis likely due to allergic reaction to eyelash glue, presenting with itching, puffiness, and sores. Benadryl has been used for symptomatic relief. - Prescribed triamcinolone  cream for topical application to reduce inflammation.  General health maintenance Colonoscopy last performed in 2021 with no polyps, next due in 2031. Mammogram and eye exam completed recently. Pap smear not needed due to hysterectomy.     Return in about 3 months (around 01/19/2025) for DM, HTN.         Rockie Agent, MD  [facility] [phone] (phone) [phone] (fax)  [facility]      [1]  Outpatient Medications Prior to Visit  Medication Sig   acyclovir  (ZOVIRAX ) 400 MG tablet TAKE 1 TABLET BY MOUTH TWICE A DAY   ALPRAZolam  (XANAX ) 0.5 MG tablet Take 1 tablet (0.5 mg total) by mouth 2 (two) times daily as needed for anxiety.   amLODipine  (NORVASC ) 5 MG tablet TAKE 1 TABLET (5 MG TOTAL) BY MOUTH DAILY.   etodolac  (LODINE )  200 MG capsule TAKE 1 CAPSULE (200 MG TOTAL) BY MOUTH EVERY 8 HOURS   fluconazole  (DIFLUCAN ) 150 MG tablet Take one 150 mg tablet by mouth at onset of symptoms and repeat after 72 hours if symptoms persist   lisinopril -hydrochlorothiazide  (ZESTORETIC ) 20-12.5 MG tablet TAKE 1 TABLET BY MOUTH EVERY DAY   minoxidil  (LONITEN ) 2.5 MG tablet Take 1 tablet (2.5 mg total) by mouth daily.   SUMAtriptan  (IMITREX ) 25 MG tablet Take 1 tablet (25 mg total) by mouth every 2 (two) hours as needed for migraine. May repeat in 2 hours if headache persists or recurs.   tirzepatide  (MOUNJARO ) 5 MG/0.5ML Pen Inject 5 mg into the skin once a week.   tiZANidine  (ZANAFLEX ) 4 MG tablet TAKE 1 TABLET BY MOUTH EVERY 6 HOURS AS NEEDED FOR MUSCLE SPASMS.   glipiZIDE  (GLUCOTROL ) 5 MG tablet Take 1 tablet (5 mg total) by mouth 2 (two) times daily before a meal. (Patient not taking: Reported on 10/21/2024)   No facility-administered medications prior to visit.

## 2024-10-21 NOTE — Assessment & Plan Note (Signed)
 Type 2 diabetes mellitus with diabetic neuropathy Chronic  Diabetes is well-controlled with an A1c of 6.1. Insurance issues with Mounjaro  have been addressed through a pharmacy program, allowing continued access to medication. - Continue Mounjaro  2.5 mg as per pharmacy program. - Will repeat A1c in March.

## 2024-10-21 NOTE — Assessment & Plan Note (Signed)
 Anxiety disorder Chronic anxiety exacerbated by current health and financial stressors. Symptoms include high anxiety days and crying spells. Current medication provides some relief. - Provide a doctor's note for work to address anxiety-related absences. - continue Xanax  0.5mg  BID PRN

## 2024-10-21 NOTE — Assessment & Plan Note (Signed)
 Primary hypertension Chronic  Blood pressure is well-controlled at 128/74 mmHg. - continue amlodipine  5mg  daily  - continue lisinopril -hydrochlorothiazide  20-12.5mg  daily

## 2024-10-22 ENCOUNTER — Ambulatory Visit: Payer: Self-pay | Admitting: Family Medicine

## 2024-10-22 LAB — MICROALBUMIN / CREATININE URINE RATIO
Creatinine, Urine: 200.8 mg/dL
Microalb/Creat Ratio: 3 mg/g{creat} (ref 0–29)
Microalbumin, Urine: 6.1 ug/mL

## 2024-11-05 ENCOUNTER — Encounter: Payer: Self-pay | Admitting: Family Medicine

## 2024-11-13 ENCOUNTER — Other Ambulatory Visit: Payer: Self-pay | Admitting: Family Medicine

## 2024-11-13 DIAGNOSIS — L659 Nonscarring hair loss, unspecified: Secondary | ICD-10-CM

## 2024-11-13 NOTE — Telephone Encounter (Unsigned)
 Copied from CRM #[id number]. Topic: Clinical - Medication Refill >> Nov 13, 2024  3:12 PM Winona R wrote: Medication: minoxidil  (LONITEN ) 2.5 MG tablet  Has the patient contacted their pharmacy? No (Agent: If no, request that the patient contact the pharmacy for the refill. If patient does not wish to contact the pharmacy document the reason why and proceed with request.) (Agent: If yes, when and what did the pharmacy advise?)  This is the patient's preferred pharmacy:  [id number] [place], [place] - [address] [place] [address] Phone: [phone] Fax: [phone]  Is this the correct pharmacy for this prescription? Yes If no, delete pharmacy and type the correct one.   Has the prescription been filled recently? Yes  Is the patient out of the medication? No, almost, pt states she's been doubling up her dose and will be out soon.   Has the patient been seen for an appointment in the last year OR does the patient have an upcoming appointment? Yes  Can we respond through MyChart? Yes  Agent: Please be advised that Rx refills may take up to 3 business days. We ask that you follow-up with your pharmacy.

## 2024-11-14 NOTE — Telephone Encounter (Signed)
 Requested medication (s) are due for refill today: Yes  Requested medication (s) are on the active medication list: Yes  Last refill:  07/08/24  Future visit scheduled: Yes  Notes to clinic:  Not delegated.    Requested Prescriptions  Pending Prescriptions Disp Refills   minoxidil  (LONITEN ) 2.5 MG tablet 90 tablet 1    Sig: Take 1 tablet (2.5 mg total) by mouth daily.     Not Delegated - Cardiovascular: Vasodilators - minoxidil  Failed - 11/14/2024 12:09 PM      Failed - This refill cannot be delegated      Passed - Cl in normal range and within 360 days    Chloride  Date Value Ref Range Status  07/08/2024 101 96 - 106 mmol/L Final         Passed - K in normal range and within 360 days    Potassium  Date Value Ref Range Status  07/08/2024 4.1 3.5 - 5.2 mmol/L Final         Passed - Na in normal range and within 360 days    Sodium  Date Value Ref Range Status  07/08/2024 141 134 - 144 mmol/L Final         Passed - Last BP in normal range    BP Readings from Last 1 Encounters:  10/21/24 128/74         Passed - Last Heart Rate in normal range    Pulse Readings from Last 1 Encounters:  10/21/24 85         Passed - Valid encounter within last 3 months    Recent Outpatient Visits           3 weeks ago Primary hypertension   [place] [facility] Simmons-Robinson, [place], MD   3 months ago Type 2 diabetes mellitus with diabetic neuropathy, without long-term current use of insulin (HCC)   [facility] Simmons-Robinson, [place], MD   4 months ago Type 2 diabetes mellitus with diabetic neuropathy, without long-term current use of insulin (HCC)   [place] [facility] Simmons-Robinson, [place], MD   5 months ago Migraine without aura and without status migrainosus, not intractable   [facility] Simmons-Robinson, [place], MD   7 months ago Other specified hypotension   [place]  [facility] [place], Joaquin, PA-C       Future Appointments             In 6 days Guadlupe Lianne DASEN, MD [facility]            Passed - Weight completed in the last 12 months    Wt Readings from Last 1 Encounters:  10/21/24 253 lb 6.4 oz (114.9 kg)

## 2024-11-16 MED ORDER — MINOXIDIL 2.5 MG PO TABS: 2.5000 mg | ORAL_TABLET | Freq: Every day | ORAL | 1 refills | Status: DC

## 2024-11-17 ENCOUNTER — Other Ambulatory Visit: Payer: Self-pay

## 2024-11-17 DIAGNOSIS — L659 Nonscarring hair loss, unspecified: Secondary | ICD-10-CM

## 2024-11-17 MED ORDER — MINOXIDIL 2.5 MG PO TABS: 2.5000 mg | ORAL_TABLET | Freq: Every day | ORAL | 1 refills | Status: AC

## 2024-11-20 ENCOUNTER — Ambulatory Visit: Admitting: Obstetrics

## 2025-01-19 ENCOUNTER — Ambulatory Visit: Admitting: Family Medicine
